# Patient Record
Sex: Female | Born: 1970 | Hispanic: No | Marital: Married | Smoking: Never smoker
Health system: Southern US, Community
[De-identification: ages and names within clinical notes are randomized; demographics above are authoritative.]

## PROBLEM LIST (undated history)

## (undated) DIAGNOSIS — D3000 Benign neoplasm of unspecified kidney: Secondary | ICD-10-CM

## (undated) DIAGNOSIS — G43909 Migraine, unspecified, not intractable, without status migrainosus: Secondary | ICD-10-CM

## (undated) HISTORY — DX: Migraine, unspecified, not intractable, without status migrainosus: G43.909

## (undated) HISTORY — DX: Benign neoplasm of unspecified kidney: D30.00

---

## 2000-07-05 HISTORY — PX: BREAST BIOPSY: SHX20

## 2002-08-07 ENCOUNTER — Encounter: Payer: Self-pay | Admitting: Emergency Medicine

## 2002-08-07 ENCOUNTER — Emergency Department (HOSPITAL_COMMUNITY): Admission: EM | Admit: 2002-08-07 | Discharge: 2002-08-07 | Payer: Self-pay | Admitting: Emergency Medicine

## 2003-02-15 ENCOUNTER — Other Ambulatory Visit: Admission: RE | Admit: 2003-02-15 | Discharge: 2003-02-15 | Payer: Self-pay | Admitting: Gynecology

## 2004-10-02 ENCOUNTER — Encounter: Admission: RE | Admit: 2004-10-02 | Discharge: 2004-10-02 | Payer: Self-pay | Admitting: Obstetrics and Gynecology

## 2004-11-09 ENCOUNTER — Other Ambulatory Visit: Admission: RE | Admit: 2004-11-09 | Discharge: 2004-11-09 | Payer: Self-pay | Admitting: Gynecology

## 2007-01-02 DIAGNOSIS — A048 Other specified bacterial intestinal infections: Secondary | ICD-10-CM | POA: Insufficient documentation

## 2007-01-16 ENCOUNTER — Ambulatory Visit: Payer: Self-pay | Admitting: Internal Medicine

## 2007-01-31 ENCOUNTER — Ambulatory Visit: Payer: Self-pay | Admitting: Internal Medicine

## 2007-01-31 LAB — CONVERTED CEMR LAB
Fecal Occult Blood: NEGATIVE
OCCULT 1: NEGATIVE
OCCULT 2: NEGATIVE
OCCULT 3: NEGATIVE
OCCULT 4: NEGATIVE
OCCULT 5: NEGATIVE

## 2008-05-27 ENCOUNTER — Ambulatory Visit: Payer: Self-pay | Admitting: Women's Health

## 2008-10-21 ENCOUNTER — Other Ambulatory Visit: Admission: RE | Admit: 2008-10-21 | Discharge: 2008-10-21 | Payer: Self-pay | Admitting: Gynecology

## 2008-10-21 ENCOUNTER — Encounter: Payer: Self-pay | Admitting: Women's Health

## 2008-10-21 ENCOUNTER — Ambulatory Visit: Payer: Self-pay | Admitting: Women's Health

## 2008-12-09 ENCOUNTER — Ambulatory Visit (HOSPITAL_COMMUNITY): Admission: RE | Admit: 2008-12-09 | Discharge: 2008-12-09 | Payer: Self-pay | Admitting: Gynecology

## 2010-04-27 ENCOUNTER — Ambulatory Visit: Payer: Self-pay | Admitting: Women's Health

## 2010-04-27 ENCOUNTER — Other Ambulatory Visit: Admission: RE | Admit: 2010-04-27 | Discharge: 2010-04-27 | Payer: Self-pay | Admitting: Gynecology

## 2010-11-17 NOTE — Assessment & Plan Note (Signed)
[facility]                         GASTROENTEROLOGY OFFICE NOTE   Janet Carney, Janet Carney                        MRN:          [id number]  DATE:01/16/2007                            DOB:          02/14/1971    REASON FOR CONSULTATION:  Abdominal pain.   ASSESSMENT:  A 40 year old [place] woman who has had problems since the  age of 12 with gas, bloating, abdominal discomfort off and on.  More  recently, she was treated for H. pylori and feels better, but still has  a lot of bloating and gaseousness.  She has some urgent defecation.  She  is also complaining of vaginal itching.  There is a scanty weight loss  over the past 3 months that she attributes to a job change.  CBC  unrevealing.  CMET normal.  KUB did show a lot of stool, and she was  told to try a stool softener.   RECOMMENDATIONS AND PLAN:  1. Align probiotic once a day for a month.  2. Low-residue, low-fiber diet to help with gaseousness and food diary      as well.  3. Dicyclomine 10 mg q.a.c. nightly p.r.n. number 90 with 5 refills.  4. Home Hemoccult.  If positive, consider endoscopic evaluation, but I      do not think that is necessary now, as I think she has irritable      bowel problems.  5. Further plans pending clinical course.  If she has persistent      problems, she should return to see me.   HISTORY:  As above.  See my medical history form for further details.  She feels a little better on the stool softener, and thinks the  antibiotics or the H. pylori helped, but she is not completely resolved.  She has really had these problems over the years.   PAST MEDICAL HISTORY:  Benign breast surgery 2001.   FAMILY HISTORY:  Diabetes in siblings.   SOCIAL HISTORY:  She is married.  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  She is here with a niece.  She lives with her  husband.  No alcohol, tobacco, or drugs.   REVIEW OF SYSTEMS:  Some pain with her period.   Itching in the vaginal  area.  Back pain.  Urinary incontinence at times.  She went to the  health department and had a negative Pap smear in February, but she did  not tell them she had vaginal itching.  I have told her she should  return there to see there is no vaginal discharge or problem there.   PHYSICAL EXAMINATION:  Reveals a pleasant, well-developed Asian woman in  no acute distress.  She has a weight of 134 pounds.  Height 5 feet 4 inches.  Blood pressure  98/64.  Pulse 72.  Eyes anicteric.  Mouth free of lesions.  NECK:  Supple without thyromegaly.  CHEST:  Clear.  HEART:  S1 and S2.  No murmurs or gallops.  ABDOMEN:  Soft and nontender.  No organomegaly or mass.  LYMPHATIC:  No neck or supraclavicular  nodes.  LOWER EXTREMITIES:  Free of edema.  SKIN:  Warm and dry.  No acute rash.  NEUROLOGIC/PSYCHIATRIC:  She is alert and oriented x3.   CBC was normal as noted.  Urinalysis negative, except for 1+ blood.  CMET was normal.  H. pylori serology was positive.   I appreciate the opportunity to care for this patient.     Iva Boop, MD,FACG  Electronically Signed    CEG/MedQ  DD: 01/16/2007  DT: 01/17/2007  Job #: [id number]   cc:   Louanna Raw

## 2011-02-11 ENCOUNTER — Encounter: Payer: Self-pay | Admitting: *Deleted

## 2011-02-15 ENCOUNTER — Other Ambulatory Visit (HOSPITAL_COMMUNITY)
Admission: RE | Admit: 2011-02-15 | Discharge: 2011-02-15 | Disposition: A | Discharge status: Home / Self Care | Payer: BC Managed Care – PPO | Source: Ambulatory Visit | Attending: Women's Health | Admitting: Women's Health

## 2011-02-15 ENCOUNTER — Ambulatory Visit (INDEPENDENT_AMBULATORY_CARE_PROVIDER_SITE_OTHER): Payer: BC Managed Care – PPO | Admitting: Women's Health

## 2011-02-15 ENCOUNTER — Encounter: Payer: Self-pay | Admitting: Women's Health

## 2011-02-15 VITALS — BP 110/60 | Ht 64.0 in | Wt 138.0 lb

## 2011-02-15 DIAGNOSIS — Z01419 Encounter for gynecological examination (general) (routine) without abnormal findings: Secondary | ICD-10-CM

## 2011-02-15 DIAGNOSIS — F411 Generalized anxiety disorder: Secondary | ICD-10-CM

## 2011-02-15 DIAGNOSIS — F419 Anxiety disorder, unspecified: Secondary | ICD-10-CM

## 2011-02-15 DIAGNOSIS — R823 Hemoglobinuria: Secondary | ICD-10-CM

## 2011-02-15 DIAGNOSIS — Z113 Encounter for screening for infections with a predominantly sexual mode of transmission: Secondary | ICD-10-CM

## 2011-02-15 DIAGNOSIS — Z13220 Encounter for screening for lipoid disorders: Secondary | ICD-10-CM

## 2011-02-15 MED ORDER — ALPRAZOLAM 0.25 MG PO TABS: 0.2500 mg | ORAL_TABLET | Freq: Every evening | ORAL | Status: AC | PRN
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
## 2011-02-17 ENCOUNTER — Telehealth: Payer: Self-pay | Admitting: Women's Health

## 2011-02-17 NOTE — Telephone Encounter (Signed)
Telephone call, informed labs are good. H&H is 13/37.2. Lipid profile is normal.

## 2011-08-20 ENCOUNTER — Other Ambulatory Visit: Payer: Self-pay | Admitting: Women's Health

## 2011-08-20 DIAGNOSIS — F329 Major depressive disorder, single episode, unspecified: Secondary | ICD-10-CM

## 2011-08-23 MED ORDER — ESCITALOPRAM OXALATE 10 MG PO TABS: 10.0000 mg | ORAL_TABLET | Freq: Every day | ORAL | Status: AC

## 2011-08-23 NOTE — Progress Notes (Signed)
Telephone call to review adding a medication with continuing therapy. Has counseling sessions with Berniece Andreas weekly. Raynelle Fanning called our office to discuss medication, suggested Lexapro 10 to start. Patient is in agreement and would like to start medication and will continue therapy. Prescription has been called to [facility].

## 2012-02-18 ENCOUNTER — Encounter: Payer: Self-pay | Admitting: Women's Health

## 2012-02-18 ENCOUNTER — Ambulatory Visit (INDEPENDENT_AMBULATORY_CARE_PROVIDER_SITE_OTHER): Payer: BC Managed Care – PPO | Admitting: Women's Health

## 2012-02-18 VITALS — BP 108/70 | Ht 63.5 in | Wt 137.0 lb

## 2012-02-18 DIAGNOSIS — Z833 Family history of diabetes mellitus: Secondary | ICD-10-CM

## 2012-02-18 DIAGNOSIS — Z01419 Encounter for gynecological examination (general) (routine) without abnormal findings: Secondary | ICD-10-CM

## 2012-02-18 DIAGNOSIS — Z13220 Encounter for screening for lipoid disorders: Secondary | ICD-10-CM

## 2012-02-18 NOTE — Patient Instructions (Signed)
Mammogram  Breast center  [phone] Maintenance, Females A healthy lifestyle and preventative care can promote health and wellness.  Maintain regular health, dental, and eye exams.   Eat a healthy diet. Foods like vegetables, fruits, whole grains, low-fat dairy products, and lean protein foods contain the nutrients you need without too many calories. Decrease your intake of foods high in solid fats, added sugars, and salt. Get information about a proper diet from your caregiver, if necessary.   Regular physical exercise is one of the most important things you can do for your health. Most adults should get at least 150 minutes of moderate-intensity exercise (any activity that increases your heart rate and causes you to sweat) each week. In addition, most adults need muscle-strengthening exercises on 2 or more days a week.    Maintain a healthy weight. The body mass index (BMI) is a screening tool to identify possible weight problems. It provides an estimate of body fat based on height and weight. Your caregiver can help determine your BMI, and can help you achieve or maintain a healthy weight. For adults 20 years and older:   A BMI below 18.5 is considered underweight.   A BMI of 18.5 to 24.9 is normal.   A BMI of 25 to 29.9 is considered overweight.   A BMI of 30 and above is considered obese.   Maintain normal blood lipids and cholesterol by exercising and minimizing your intake of saturated fat. Eat a balanced diet with plenty of fruits and vegetables. Blood tests for lipids and cholesterol should begin at age 32 and be repeated every 5 years. If your lipid or cholesterol levels are high, you are over 50, or you are a high risk for heart disease, you may need your cholesterol levels checked more frequently.Ongoing high lipid and cholesterol levels should be treated with medicines if diet and exercise are not effective.   If you smoke, find out from your caregiver how to quit. If you do  not use tobacco, do not start.   If you are pregnant, do not drink alcohol. If you are breastfeeding, be very cautious about drinking alcohol. If you are not pregnant and choose to drink alcohol, do not exceed 1 drink per day. One drink is considered to be 12 ounces (355 mL) of beer, 5 ounces (148 mL) of wine, or 1.5 ounces (44 mL) of liquor.   Avoid use of street drugs. Do not share needles with anyone. Ask for help if you need support or instructions about stopping the use of drugs.   High blood pressure causes heart disease and increases the risk of stroke. Blood pressure should be checked at least every 1 to 2 years. Ongoing high blood pressure should be treated with medicines, if weight loss and exercise are not effective.   If you are 39 to 43 years old, ask your caregiver if you should take aspirin to prevent strokes.   Diabetes screening involves taking a blood sample to check your fasting blood sugar level. This should be done once every 3 years, after age 12, if you are within normal weight and without risk factors for diabetes. Testing should be considered at a younger age or be carried out more frequently if you are overweight and have at least 1 risk factor for diabetes.   Breast cancer screening is essential preventative care for women. You should practice "breast self-awareness." This means understanding the normal appearance and feel of your breasts and may include breast self-examination. Any  changes detected, no matter how small, should be reported to a caregiver. Women in their 60s and 30s should have a clinical breast exam (CBE) by a caregiver as part of a regular health exam every 1 to 3 years. After age 20, women should have a CBE every year. Starting at age 31, women should consider having a mammogram (breast X-ray) every year. Women who have a family history of breast cancer should talk to their caregiver about genetic screening. Women at a high risk of breast cancer should talk  to their caregiver about having an MRI and a mammogram every year.   The Pap test is a screening test for cervical cancer. Women should have a Pap test starting at age 52. Between ages 45 and 39, Pap tests should be repeated every 2 years. Beginning at age 4, you should have a Pap test every 3 years as long as the past 3 Pap tests have been normal. If you had a hysterectomy for a problem that was not cancer or a condition that could lead to cancer, then you no longer need Pap tests. If you are between ages 35 and 45, and you have had normal Pap tests going back 10 years, you no longer need Pap tests. If you have had past treatment for cervical cancer or a condition that could lead to cancer, you need Pap tests and screening for cancer for at least 20 years after your treatment. If Pap tests have been discontinued, risk factors (such as a new sexual partner) need to be reassessed to determine if screening should be resumed. Some women have medical problems that increase the chance of getting cervical cancer. In these cases, your caregiver may recommend more frequent screening and Pap tests.   The human papillomavirus (HPV) test is an additional test that may be used for cervical cancer screening. The HPV test looks for the virus that can cause the cell changes on the cervix. The cells collected during the Pap test can be tested for HPV. The HPV test could be used to screen women aged 7 years and older, and should be used in women of any age who have unclear Pap test results. After the age of 23, women should have HPV testing at the same frequency as a Pap test.   Colorectal cancer can be detected and often prevented. Most routine colorectal cancer screening begins at the age of 60 and continues through age 32. However, your caregiver may recommend screening at an earlier age if you have risk factors for colon cancer. On a yearly basis, your caregiver may provide home test kits to check for hidden blood in the  stool. Use of a small camera at the end of a tube, to directly examine the colon (sigmoidoscopy or colonoscopy), can detect the earliest forms of colorectal cancer. Talk to your caregiver about this at age 79, when routine screening begins. Direct examination of the colon should be repeated every 5 to 10 years through age 40, unless early forms of pre-cancerous polyps or small growths are found.   Hepatitis C blood testing is recommended for all people born from [birth year] through 1965 and any individual with known risks for hepatitis C.   Practice safe sex. Use condoms and avoid high-risk sexual practices to reduce the spread of sexually transmitted infections (STIs). Sexually active women aged 34 and younger should be checked for Chlamydia, which is a common sexually transmitted infection. Older women with new or multiple partners should also be tested  for Chlamydia. Testing for other STIs is recommended if you are sexually active and at increased risk.   Osteoporosis is a disease in which the bones lose minerals and strength with aging. This can result in serious bone fractures. The risk of osteoporosis can be identified using a bone density scan. Women ages 73 and over and women at risk for fractures or osteoporosis should discuss screening with their caregivers. Ask your caregiver whether you should be taking a calcium supplement or vitamin D to reduce the rate of osteoporosis.   Menopause can be associated with physical symptoms and risks. Hormone replacement therapy is available to decrease symptoms and risks. You should talk to your caregiver about whether hormone replacement therapy is right for you.   Use sunscreen with a sun protection factor (SPF) of 30 or greater. Apply sunscreen liberally and repeatedly throughout the day. You should seek shade when your shadow is shorter than you. Protect yourself by wearing long sleeves, pants, a wide-brimmed hat, and sunglasses year round, whenever you are  outdoors.   Notify your caregiver of new moles or changes in moles, especially if there is a change in shape or color. Also notify your caregiver if a mole is larger than the size of a pencil eraser.   Stay current with your immunizations.  Document Released: 01/04/2011 Document Revised: 06/10/2011 Document Reviewed: 01/04/2011 [facility] Patient Information 2012 [place], [place].

## 2012-02-18 NOTE — Progress Notes (Signed)
Janet Carney 1970/07/15 [id number]    History:    The patient presents for annual exam.  Monthly 4-5 day cycle until June. July had light spotting for several days, today started cycle. Has rare intercourse uses condoms. Has had slight vaginal burning and irritation. Denies discharge with itching or odor. Denies pain or burning with urination but occasional frequency. History of normal Paps, normal mammogram at age 41 overdue. History of a breast biopsy in 2002 benign.   Past medical history, past surgical history, family history and social history were all reviewed and documented in the EPIC chart. Works at [employer]. Son 21 doing well in [place], daughter 60 doing well. From [place], has had some financial and cultural marital issues, denies physical abuse or infidelity. Father and 2 siblings type II diabetic, meds or diet, no insulin.   ROS:  A  ROS was performed and pertinent positives and negatives are included in the history.  Exam:  Filed Vitals:   02/18/12 1558  BP: 108/70    General appearance:  Normal Head/Neck:  Normal, without cervical or supraclavicular adenopathy. Thyroid:  Symmetrical, normal in size, without palpable masses or nodularity. Respiratory  Effort:  Normal  Auscultation:  Clear without wheezing or rhonchi Cardiovascular  Auscultation:  Regular rate, without rubs, murmurs or gallops  Edema/varicosities:  Not grossly evident Abdominal  Soft,nontender, without masses, guarding or rebound.  Liver/spleen:  No organomegaly noted  Hernia:  None appreciated  Skin  Inspection:  Grossly normal  Palpation:  Grossly normal Neurologic/psychiatric  Orientation:  Normal with appropriate conversation.  Mood/affect:  Normal  Genitourinary    Breasts: Examined lying and sitting.     Right: Without masses, retractions, discharge or axillary adenopathy.     Left: Without masses, retractions, discharge or axillary adenopathy.   Inguinal/mons:  Normal without inguinal  adenopathy  External genitalia:  Normal, no erythema noted  BUS/Urethra/Skene's glands:  Normal  Bladder:  Normal  Vagina:  Normal menstrual blood present no erythema or odor noted  Cervix:  Normal  Uterus:   normal in size, shape and contour.  Midline and mobile  Adnexa/parametria:     Rt: Without masses or tenderness.   Lt: Without masses or tenderness.  Anus and perineum: Normal  Digital rectal exam: Normal sphincter tone without palpated masses or tenderness  Assessment/Plan:  41 y.o. MAF G2 P2  for annual exam.   Normal GYN exam  Plan: Reviewed cycle appears normal today, instructed to keep menstrual record call if cycles less than 21 days her greater than 60. Contraception options reviewed and declined will continue with condoms. SBE's, schedule a mammogram, reviewed importance of annual screen. Encouraged regular exercise, calcium rich diet, vitamin D 1000 daily. Encouraged counseling for marital issues, no feelings of harming self. CBC, glucose, lipid panel and UA. No Pap history of normal Paps reviewed new screening guidelines.    Harrington Challenger [facility], 4:31 PM 02/18/2012

## 2012-02-19 LAB — URINALYSIS W MICROSCOPIC + REFLEX CULTURE
Bacteria, UA: NONE SEEN
Bilirubin Urine: NEGATIVE
Casts: NONE SEEN
Crystals: NONE SEEN
Ketones, ur: NEGATIVE mg/dL
Specific Gravity, Urine: 1.011 (ref 1.005–1.030)
Urobilinogen, UA: 0.2 mg/dL (ref 0.0–1.0)

## 2012-02-19 LAB — CBC WITH DIFFERENTIAL/PLATELET
Basophils Absolute: 0.1 10*3/uL (ref 0.0–0.1)
Eosinophils Relative: 1 % (ref 0–5)
HCT: 37 % (ref 36.0–46.0)
Hemoglobin: 12.7 g/dL (ref 12.0–15.0)
Lymphocytes Relative: 31 % (ref 12–46)
Lymphs Abs: 3.9 10*3/uL (ref 0.7–4.0)
MCV: 87.5 fL (ref 78.0–100.0)
Monocytes Absolute: 0.8 10*3/uL (ref 0.1–1.0)
Monocytes Relative: 7 % (ref 3–12)
Neutro Abs: 7.5 10*3/uL (ref 1.7–7.7)
RBC: 4.23 MIL/uL (ref 3.87–5.11)
WBC: 12.5 10*3/uL — ABNORMAL HIGH (ref 4.0–10.5)

## 2012-02-19 LAB — LIPID PANEL
Cholesterol: 196 mg/dL (ref 0–200)
HDL: 91 mg/dL (ref >39)
Triglycerides: 90 mg/dL (ref <150)
VLDL: 18 mg/dL (ref 0–40)

## 2012-02-23 ENCOUNTER — Encounter (HOSPITAL_COMMUNITY): Payer: Self-pay | Admitting: *Deleted

## 2012-02-23 ENCOUNTER — Emergency Department (HOSPITAL_COMMUNITY)
Admission: EM | Admit: 2012-02-23 | Discharge: 2012-02-24 | Disposition: A | Discharge status: Home / Self Care | Payer: BC Managed Care – PPO | Attending: Emergency Medicine | Admitting: Emergency Medicine

## 2012-02-23 DIAGNOSIS — Z833 Family history of diabetes mellitus: Secondary | ICD-10-CM | POA: Insufficient documentation

## 2012-02-23 DIAGNOSIS — R197 Diarrhea, unspecified: Secondary | ICD-10-CM | POA: Insufficient documentation

## 2012-02-23 DIAGNOSIS — R1110 Vomiting, unspecified: Secondary | ICD-10-CM | POA: Insufficient documentation

## 2012-02-23 DIAGNOSIS — Z801 Family history of malignant neoplasm of trachea, bronchus and lung: Secondary | ICD-10-CM | POA: Insufficient documentation

## 2012-02-23 LAB — URINALYSIS, ROUTINE W REFLEX MICROSCOPIC
Glucose, UA: NEGATIVE mg/dL
Hgb urine dipstick: NEGATIVE
Leukocytes, UA: NEGATIVE
Specific Gravity, Urine: 1.013 (ref 1.005–1.030)
Urobilinogen, UA: 0.2 mg/dL (ref 0.0–1.0)

## 2012-02-23 LAB — BASIC METABOLIC PANEL
CO2: 26 mEq/L (ref 19–32)
Chloride: 100 mEq/L (ref 96–112)
Creatinine, Ser: 0.64 mg/dL (ref 0.50–1.10)
GFR calc Af Amer: >90 mL/min (ref >90)
Potassium: 3.8 mEq/L (ref 3.5–5.1)

## 2012-02-23 LAB — CBC
MCV: 86.5 fL (ref 78.0–100.0)
Platelets: 255 10*3/uL (ref 150–400)
RBC: 4.15 MIL/uL (ref 3.87–5.11)
WBC: 12.1 10*3/uL — ABNORMAL HIGH (ref 4.0–10.5)

## 2012-02-23 LAB — POCT PREGNANCY, URINE: Preg Test, Ur: NEGATIVE

## 2012-02-23 MED ORDER — ONDANSETRON 4 MG PO TBDP
8.0000 mg | ORAL_TABLET | Freq: Once | ORAL | Status: AC
  Administered 2012-02-23: 8 mg via ORAL

## 2012-02-23 MED ORDER — ONDANSETRON HCL 4 MG PO TABS: 4.0000 mg | ORAL_TABLET | Freq: Four times a day (QID) | ORAL | Status: AC

## 2012-02-23 NOTE — ED Notes (Signed)
Prescription given with discharge instructions.  

## 2012-02-23 NOTE — ED Provider Notes (Signed)
History     CSN: [id number]  Arrival date & time 02/23/12  1931   First MD Initiated Contact with Patient 02/23/12 2115      Chief Complaint  Patient presents with  . Abdominal Pain  . Nausea  . Headache    (Consider location/radiation/quality/duration/timing/severity/associated sxs/prior treatment) Patient is a 41 y.o. female presenting with vomiting. The history is provided by the patient.  Emesis  This is a new problem. The current episode started 3 to 5 hours ago. The problem occurs 2 to 4 times per day (3). The problem has been resolved. The emesis has an appearance of stomach contents. There has been no fever. Associated symptoms include abdominal pain, diarrhea and headaches (mild frontal headache). Pertinent negatives include no chills, no cough, no fever and no sweats.  Pt at a larger than normal amount of fruit this afternoon.  Shortly after she had onset of vomiting.  Pt also had epigastric pain.  Sx resolved including abdominal pain after Zofran given in triage.  History reviewed. No pertinent past medical history.  Past Surgical History  Procedure Date  . Breast biopsy 2002    Family History  Problem Relation Age of Onset  . Lung cancer Father   . Cancer Father     lung cancer  . Diabetes Father     History  Substance Use Topics  . Smoking status: Never Smoker   . Smokeless tobacco: Never Used  . Alcohol Use: No    OB History    Grav Para Term Preterm Abortions TAB SAB Ect Mult Living   3 2   1            Review of Systems  Constitutional: Negative for fever and chills.  Respiratory: Negative for cough.   Gastrointestinal: Positive for vomiting, abdominal pain and diarrhea. Negative for blood in stool and anal bleeding.  Genitourinary: Negative for dysuria, urgency, frequency, vaginal bleeding, vaginal discharge and pelvic pain.  Neurological: Positive for headaches (mild frontal headache).  All other systems reviewed and are  negative.    Allergies  Review of patient's allergies indicates no known allergies.  Home Medications   Current Outpatient Rx  Name Route Sig Dispense Refill  . CETIRIZINE HCL 10 MG PO TABS Oral Take 10 mg by mouth daily as needed. For cold/allergies    . ONDANSETRON HCL 4 MG PO TABS Oral Take 1 tablet (4 mg total) by mouth every 6 (six) hours. 12 tablet 0    BP 113/73  Pulse 70  Temp 97.6 F (36.4 C) (Oral)  Resp 18  SpO2 100%  LMP 02/16/2012  Physical Exam  Nursing note and vitals reviewed. Constitutional: She is oriented to person, place, and time. She appears well-developed and well-nourished. No distress.  HENT:  Head: Normocephalic and atraumatic.  Eyes: Conjunctivae are normal.  Neck: Neck supple.  Cardiovascular: Normal rate, regular rhythm, normal heart sounds and intact distal pulses.   Pulmonary/Chest: Effort normal and breath sounds normal. She has no wheezes. She has no rales.  Abdominal: Soft. She exhibits no distension. There is no tenderness.  Musculoskeletal: Normal range of motion.  Neurological: She is alert and oriented to person, place, and time.  Skin: Skin is warm and dry.    ED Course  Procedures (including critical care time)  Labs Reviewed  URINALYSIS, ROUTINE W REFLEX MICROSCOPIC - Abnormal; Notable for the following:    pH 8.5 (*)     Ketones, ur 15 (*)     All other  components within normal limits  CBC - Abnormal; Notable for the following:    WBC 12.1 (*)     HCT 35.9 (*)     All other components within normal limits  BASIC METABOLIC PANEL - Abnormal; Notable for the following:    Glucose, Bld 136 (*)     All other components within normal limits  POCT PREGNANCY, URINE   No results found.   1. Vomiting   2. Diarrhea       MDM  41 yo female with no prior PMHx presents for 3 episodes of vomiting and associated epigastric abdominal pain.  Pt reports she has had problems with her stomach in the past and is only able to eat a  small amount of food at a time.  This afternoon she had a larger than normal amount of fruit.  Shortly after she had onset of non-bloody, non-bilious emesis.  She also reports 2 episodes of diarrhea this afternoon.  Onset of epigastric pain was after vomiting.  No lower abdominal pain or tenderness.  No signs of cholecystitis, appendicitis, torsion, or SBO.  No vaginal discharge or bleeding.  No dysuria.  Pt reports period had been regular until June.  She had a lighter than normal period in June, no period in July, and only a small amount of spotting this month.  Sx including abdominal pain resolved after Zofran given in triage.  AF, VSS, NAD.  CBC with slight leukocytosis.  BMP wnl.  UA negative for UTI and UPT negative.  Pt without further episodes of vomiting in the ED and appropriate for DC home.  Will DC with Rx for Zofran and PCP follow-up.  Return precautions provided.        Cherre Robins, MD 02/24/12 [phone]

## 2012-02-23 NOTE — ED Notes (Signed)
Warm blankets provided to pt. while waiting at triage.

## 2012-02-23 NOTE — ED Notes (Signed)
Patient with abdominal cramping with n/v/d since 1100 this morning, patient also with c/o headache, patient doubled over clutching abdomen stating intense pain

## 2012-02-23 NOTE — ED Notes (Signed)
Pt. Reports N/V/D starting at 1100 this AM. States hx of same. States "if I don't eat the right foods this happens to me". Pt. Currently pain free and denies nausea.

## 2012-02-24 MED ORDER — PROMETHAZINE HCL 25 MG/ML IJ SOLN
12.5000 mg | INTRAMUSCULAR | Status: DC | PRN
  Administered 2012-02-24: 12.5 mg via INTRAVENOUS
  Filled 2012-02-24: qty 1

## 2012-02-24 NOTE — ED Provider Notes (Signed)
I  reviewed the resident's note and I agree with the findings and plan.  Cheri Guppy, MD 02/24/12 [phone]

## 2012-04-14 ENCOUNTER — Emergency Department (HOSPITAL_COMMUNITY)
Admission: EM | Admit: 2012-04-14 | Discharge: 2012-04-14 | Disposition: A | Discharge status: Home / Self Care | Payer: BC Managed Care – PPO | Attending: Emergency Medicine | Admitting: Emergency Medicine

## 2012-04-14 ENCOUNTER — Encounter (HOSPITAL_COMMUNITY): Payer: Self-pay | Admitting: *Deleted

## 2012-04-14 ENCOUNTER — Emergency Department (HOSPITAL_COMMUNITY): Payer: BC Managed Care – PPO

## 2012-04-14 DIAGNOSIS — R070 Pain in throat: Secondary | ICD-10-CM | POA: Insufficient documentation

## 2012-04-14 MED ORDER — BENZOCAINE-MENTHOL 15-3.6 MG MT LOZG: 1.0000 | LOZENGE | Freq: Three times a day (TID) | OROMUCOSAL | Status: DC

## 2012-04-14 NOTE — ED Notes (Signed)
Pt alert, NAD, calm, interactive, resps e/u, speaking in clear complete sentences, no drooling or spitting. Reports "wants to leave". Wait, process & plan explained with rationale. States, "will wait 30 more minutes".

## 2012-04-14 NOTE — ED Provider Notes (Signed)
History     CSN: [id number]  Arrival date & time 04/14/12  4098   First MD Initiated Contact with Patient 04/14/12 2113      Chief Complaint  Patient presents with  . Choking   HPI  History provided by the patient and daughter. Patient is a 41 year old female with no significant PMH who presents with complaints of pain in the throat and neck area. Patient states that she feels that she may have a piece of salad stuck in her throat. She was eating salad earlier today around noon when she suddenly had a sensation of choking and coughing and since that time is felt discomfort in her throat and neck area. Symptoms are worse with swallowing. Patient has been able to swallow any since that time. Patient denies any breathing problems to me. Denies any shortness of breath or stridor. She denies having similar symptoms previously. Her salad was made of lettuce and salad dressing. Denies any meats or croutons.    History reviewed. No pertinent past medical history.  Past Surgical History  Procedure Date  . Breast biopsy 2002    Family History  Problem Relation Age of Onset  . Lung cancer Father   . Cancer Father     lung cancer  . Diabetes Father     History  Substance Use Topics  . Smoking status: Never Smoker   . Smokeless tobacco: Never Used  . Alcohol Use: No    OB History    Grav Para Term Preterm Abortions TAB SAB Ect Mult Living   3 2   1            Review of Systems  Constitutional: Negative for fever.  HENT: Positive for sore throat, trouble swallowing and neck pain.   Respiratory: Negative for shortness of breath and stridor.   Gastrointestinal: Negative for nausea and vomiting.  Skin: Negative for rash.    Allergies  Review of patient's allergies indicates no known allergies.  Home Medications   Current Outpatient Rx  Name Route Sig Dispense Refill  . CETIRIZINE HCL 10 MG PO TABS Oral Take 10 mg by mouth daily as needed. For cold/allergies      BP  125/84  Pulse 80  Temp 98.2 F (36.8 C) (Oral)  Resp 22  SpO2 100%  Physical Exam  Nursing note and vitals reviewed. Constitutional: She is oriented to person, place, and time. She appears well-developed and well-nourished. No distress.  HENT:  Head: Normocephalic.  Mouth/Throat: Oropharynx is clear and moist.  Neck: Normal range of motion. Neck supple.       Mild to moderate tenderness over right hyoid area.  No deformity.  Normal movement with swallow. No movement.   Cardiovascular: Normal rate and regular rhythm.   No murmur heard. Pulmonary/Chest: Effort normal and breath sounds normal. No stridor. No respiratory distress. She has no wheezes. She has no rales.  Neurological: She is alert and oriented to person, place, and time.  Skin: Skin is warm and dry. No rash noted.  Psychiatric: She has a normal mood and affect. Her behavior is normal.    ED Course  Procedures   Dg Neck Soft Tissue  04/14/2012  *RADIOLOGY REPORT*  Clinical Data: Choking on food, anterior neck pain.  NECK SOFT TISSUES - 1+ VIEW  Comparison: None.  Findings: Irregular calcification immediately inferior to the vocal cords may reflect an unusual appearance of the thyroid cartilage.  However, there is associated smooth prevertebral soft tissue swelling, likely reflecting  laryngeal edema.  IMPRESSION: Smooth prevertebral soft tissue swelling, likely reflecting laryngeal edema.  Consider CT neck for further evaluation as clinically warranted.   Original Report Authenticated By: Charline Bills, M.D.      1. Throat pain       MDM  9:45PM patient seen and evaluated. Patient denies any respiratory difficulty to me. Pain with swallowing. Patient is able to swallow fluids normally. Patient is maintaining secretions.   X-rays with possible signs of slight swelling. This may be local reaction from irritation or injury. No hemoptysis. No crepitus and neck. Patient does not appear to have any emergent symptoms at  this time. Will give symptomatic treatment and have patient followup with ENT or return for any worsening symptoms. Patient was discussed with attending physician who agrees with treatment plan        Angus Seller, PA 04/15/12 [phone]

## 2012-04-14 NOTE — ED Notes (Signed)
The pt choked on a salad at 1200n today.  She feel ike she cannot breathe.  She can drink water and it stays down.

## 2012-04-14 NOTE — ED Notes (Signed)
Pt reports foreign body sensation in her throat that began a few hours ago after eating a salad - pt speaking complete sentences, handling secretions - in no acute distress A&Ox4. Pt also reports sore throat as well.

## 2012-04-14 NOTE — ED Notes (Signed)
Rx given x1 Pt ambulating independently w/ steady gait on d/c in no acute distress, A&Ox4. D/c instructions reviewed w/ pt and family - pt and family deny any further questions or concerns at present.  

## 2012-04-14 NOTE — ED Notes (Signed)
She had ahmburger in her salad and she feels like something is caught inher throat.

## 2012-04-15 NOTE — ED Provider Notes (Signed)
Medical screening examination/treatment/procedure(s) were performed by non-physician practitioner and as supervising physician I was immediately available for consultation/collaboration.   Gwyneth Sprout, MD 04/15/12 [phone]

## 2012-08-09 ENCOUNTER — Other Ambulatory Visit: Payer: Self-pay | Admitting: Women's Health

## 2012-08-09 DIAGNOSIS — Z1231 Encounter for screening mammogram for malignant neoplasm of breast: Secondary | ICD-10-CM

## 2012-08-17 ENCOUNTER — Ambulatory Visit (HOSPITAL_COMMUNITY): Payer: BC Managed Care – PPO

## 2012-08-24 ENCOUNTER — Ambulatory Visit (HOSPITAL_COMMUNITY)
Admission: RE | Admit: 2012-08-24 | Discharge: 2012-08-24 | Disposition: A | Discharge status: Home / Self Care | Payer: BC Managed Care – PPO | Source: Ambulatory Visit | Attending: Women's Health | Admitting: Women's Health

## 2012-08-24 DIAGNOSIS — Z1231 Encounter for screening mammogram for malignant neoplasm of breast: Secondary | ICD-10-CM

## 2012-11-03 ENCOUNTER — Ambulatory Visit (INDEPENDENT_AMBULATORY_CARE_PROVIDER_SITE_OTHER): Payer: BC Managed Care – PPO | Admitting: Gynecology

## 2012-11-03 ENCOUNTER — Encounter: Payer: Self-pay | Admitting: Gynecology

## 2012-11-03 DIAGNOSIS — R300 Dysuria: Secondary | ICD-10-CM

## 2012-11-03 DIAGNOSIS — N390 Urinary tract infection, site not specified: Secondary | ICD-10-CM

## 2012-11-03 LAB — URINALYSIS W MICROSCOPIC + REFLEX CULTURE
Casts: NONE SEEN
Crystals: NONE SEEN
Ketones, ur: NEGATIVE mg/dL
Nitrite: NEGATIVE
Specific Gravity, Urine: 1.02 (ref 1.005–1.030)
Urobilinogen, UA: 0.2 mg/dL (ref 0.0–1.0)
pH: 7 (ref 5.0–8.0)

## 2012-11-03 MED ORDER — SULFAMETHOXAZOLE-TMP DS 800-160 MG PO TABS: 1.0000 | ORAL_TABLET | Freq: Two times a day (BID) | ORAL | Status: DC

## 2012-11-03 NOTE — Progress Notes (Signed)
Patient presents with three-day history of dysuria. Does have a history of occasional UTIs in the past. Some frequency. No blood in her urine, back pain, fever chills nausea vomiting.  Exam Spine straight without CVA tenderness. Abdomen soft nontender without masses guarding rebound organomegaly.  Assessment and plan: History and urinalysis consistent with UTI. Treat with Septra DS 1 by mouth twice a day x3 days. Followup if symptoms persist worsen or recur.

## 2012-11-03 NOTE — Patient Instructions (Signed)
Take antibiotic twice daily for 3 days. Follow up if your symptoms persist, worsen or recur. 

## 2013-01-08 ENCOUNTER — Emergency Department (HOSPITAL_COMMUNITY): Payer: BC Managed Care – PPO

## 2013-01-08 ENCOUNTER — Encounter (HOSPITAL_COMMUNITY): Payer: Self-pay | Admitting: Emergency Medicine

## 2013-01-08 ENCOUNTER — Observation Stay (HOSPITAL_COMMUNITY)
Admission: EM | Admit: 2013-01-08 | Discharge: 2013-01-09 | Disposition: A | Discharge status: Home / Self Care | Payer: BC Managed Care – PPO | Attending: Internal Medicine | Admitting: Internal Medicine

## 2013-01-08 DIAGNOSIS — G43109 Migraine with aura, not intractable, without status migrainosus: Principal | ICD-10-CM | POA: Insufficient documentation

## 2013-01-08 DIAGNOSIS — R4789 Other speech disturbances: Secondary | ICD-10-CM

## 2013-01-08 DIAGNOSIS — G43909 Migraine, unspecified, not intractable, without status migrainosus: Secondary | ICD-10-CM

## 2013-01-08 DIAGNOSIS — R4701 Aphasia: Secondary | ICD-10-CM | POA: Insufficient documentation

## 2013-01-08 DIAGNOSIS — R209 Unspecified disturbances of skin sensation: Secondary | ICD-10-CM | POA: Insufficient documentation

## 2013-01-08 DIAGNOSIS — R51 Headache: Secondary | ICD-10-CM

## 2013-01-08 DIAGNOSIS — G43809 Other migraine, not intractable, without status migrainosus: Secondary | ICD-10-CM

## 2013-01-08 DIAGNOSIS — G459 Transient cerebral ischemic attack, unspecified: Secondary | ICD-10-CM

## 2013-01-08 LAB — COMPREHENSIVE METABOLIC PANEL
ALT: 11 U/L (ref 0–35)
Alkaline Phosphatase: 53 U/L (ref 39–117)
BUN: 9 mg/dL (ref 6–23)
CO2: 28 mEq/L (ref 19–32)
Calcium: 9.3 mg/dL (ref 8.4–10.5)
GFR calc Af Amer: >90 mL/min (ref >90)
GFR calc non Af Amer: >90 mL/min (ref >90)
Glucose, Bld: 96 mg/dL (ref 70–99)
Potassium: 4.1 mEq/L (ref 3.5–5.1)
Sodium: 137 mEq/L (ref 135–145)
Total Protein: 6.7 g/dL (ref 6.0–8.3)

## 2013-01-08 LAB — CBC WITH DIFFERENTIAL/PLATELET
Eosinophils Absolute: 0.1 10*3/uL (ref 0.0–0.7)
Eosinophils Relative: 2 % (ref 0–5)
Hemoglobin: 12.6 g/dL (ref 12.0–15.0)
Lymphocytes Relative: 32 % (ref 12–46)
Lymphs Abs: 2.9 10*3/uL (ref 0.7–4.0)
MCH: 29.9 pg (ref 26.0–34.0)
MCV: 85.3 fL (ref 78.0–100.0)
Monocytes Relative: 9 % (ref 3–12)
Platelets: 255 10*3/uL (ref 150–400)
RBC: 4.21 MIL/uL (ref 3.87–5.11)
WBC: 8.9 10*3/uL (ref 4.0–10.5)

## 2013-01-08 LAB — URINALYSIS, ROUTINE W REFLEX MICROSCOPIC
Bilirubin Urine: NEGATIVE
Ketones, ur: NEGATIVE mg/dL
Leukocytes, UA: NEGATIVE
Nitrite: NEGATIVE
Urobilinogen, UA: 0.2 mg/dL (ref 0.0–1.0)

## 2013-01-08 MED ORDER — ASPIRIN EC 81 MG PO TBEC
81.0000 mg | DELAYED_RELEASE_TABLET | Freq: Every day | ORAL | Status: DC
  Administered 2013-01-09: 81 mg via ORAL
  Filled 2013-01-08: qty 1

## 2013-01-08 MED ORDER — ASPIRIN 81 MG PO CHEW
324.0000 mg | CHEWABLE_TABLET | Freq: Once | ORAL | Status: AC
  Administered 2013-01-08: 324 mg via ORAL
  Filled 2013-01-08: qty 4

## 2013-01-08 NOTE — ED Notes (Signed)
PT. REPORTS BRIEF EPISODE OF GENERALIZED WEAKNESS WITH PALPITATIONS , RIGHT SIDE BODY NUMBNESS /HEADACHE AT 2 PM THIS AFTERNOON , ALERT AND ORIENTED , DENIES CHEST PAIN OR SOB , SPEECH CLEAR , NO FACIAL ASYMMETRY , EQUAL GRIPS / AMBULATORY.

## 2013-01-08 NOTE — ED Provider Notes (Signed)
History    CSN: [id number] Arrival date & time 01/08/13  1959  First MD Initiated Contact with Patient 01/08/13 2147     Chief Complaint  Patient presents with  . Weakness  . Palpitations  . Numbness   (Consider location/radiation/quality/duration/timing/severity/associated sxs/prior Treatment) HPI complains of difficulty throbbing headache gradual onset approximately 5 days ago  today at 12 noon he developed  difficulty speaking i.e. she is saying words that she did not mean to say. She also reports difficulty using her right hand for approximately 2 hours and numbness in her right hand. She is presently feels much improved, without treatment. Only complains of mild headache presently. She also had palpitations i.e. rapid heartbeat for approximately 2 hours. No other associated symptoms. Nothing makes symptoms better or worse however symptoms improved spontaneously without treatment. History reviewed. No pertinent past medical history. Past Surgical History  Procedure Laterality Date  . Breast biopsy  2002   Family History  Problem Relation Age of Onset  . Lung cancer Father   . Cancer Father     lung cancer  . Diabetes Father    History  Substance Use Topics  . Smoking status: Never Smoker   . Smokeless tobacco: Never Used  . Alcohol Use: No   OB History   Grav Para Term Preterm Abortions TAB SAB Ect Mult Living   3 2   1           Review of Systems  Cardiovascular: Positive for palpitations.  Neurological: Positive for speech difficulty, weakness and numbness.       Weakness and right hand  All other systems reviewed and are negative.    Allergies  Review of patient's allergies indicates no known allergies.  Home Medications   Current Outpatient Rx  Name  Route  Sig  Dispense  Refill  . ibuprofen (ADVIL,MOTRIN) 200 MG tablet   Oral   Take 200 mg by mouth every 6 (six) hours as needed for pain or headache.          BP 128/72  Pulse 69  Temp(Src) 98.3 F  (36.8 C) (Oral)  Resp 14  SpO2 98%  LMP 12/18/2012 Physical Exam  Nursing note and vitals reviewed. Constitutional: She is oriented to person, place, and time. She appears well-developed and well-nourished.  HENT:  Head: Normocephalic and atraumatic.  Eyes: Conjunctivae are normal. Pupils are equal, round, and reactive to light.  Neck: Neck supple. No tracheal deviation present. No thyromegaly present.  Cardiovascular: Normal rate and regular rhythm.   No murmur heard. Pulmonary/Chest: Effort normal and breath sounds normal.  Abdominal: Soft. Bowel sounds are normal. She exhibits no distension. There is no tenderness.  Musculoskeletal: Normal range of motion. She exhibits no edema and no tenderness.  Neurological: She is alert and oriented to person, place, and time. She has normal reflexes. Coordination normal.  Gait normal Romberg normal prior drift normal  Skin: Skin is warm and dry. No rash noted.  Psychiatric: She has a normal mood and affect.    ED Course  Procedures (including critical care time) Labs Reviewed  CBC WITH DIFFERENTIAL - Abnormal; Notable for the following:    HCT 35.9 (*)    All other components within normal limits  COMPREHENSIVE METABOLIC PANEL - Abnormal; Notable for the following:    Total Bilirubin 0.2 (*)    All other components within normal limits  URINALYSIS, ROUTINE W REFLEX MICROSCOPIC - Abnormal; Notable for the following:    Hgb urine dipstick  MODERATE (*)    All other components within normal limits  URINE MICROSCOPIC-ADD ON  POCT PREGNANCY, URINE   No results found. No diagnosis found.  Date: 01/08/2013  Rate:   Rhythm: normal sinus rhythm  QRS Axis: normal  Intervals: normal  ST/T Wave abnormalities: normal  Conduction Disutrbances: none  Narrative Interpretation: unremarkable  Results for orders placed during the hospital encounter of 01/08/13  CBC WITH DIFFERENTIAL      Result Value Range   WBC 8.9  4.0 - 10.5 K/uL   RBC  4.21  3.87 - 5.11 MIL/uL   Hemoglobin 12.6  12.0 - 15.0 g/dL   HCT 16.1 (*) 09.6 - 04.5 %   MCV 85.3  78.0 - 100.0 fL   MCH 29.9  26.0 - 34.0 pg   MCHC 35.1  30.0 - 36.0 g/dL   RDW 40.9  81.1 - 91.4 %   Platelets 255  150 - 400 K/uL   Neutrophils Relative % 57  43 - 77 %   Neutro Abs 5.1  1.7 - 7.7 K/uL   Lymphocytes Relative 32  12 - 46 %   Lymphs Abs 2.9  0.7 - 4.0 K/uL   Monocytes Relative 9  3 - 12 %   Monocytes Absolute 0.8  0.1 - 1.0 K/uL   Eosinophils Relative 2  0 - 5 %   Eosinophils Absolute 0.1  0.0 - 0.7 K/uL   Basophils Relative 0  0 - 1 %   Basophils Absolute 0.0  0.0 - 0.1 K/uL  COMPREHENSIVE METABOLIC PANEL      Result Value Range   Sodium 137  135 - 145 mEq/L   Potassium 4.1  3.5 - 5.1 mEq/L   Chloride 101  96 - 112 mEq/L   CO2 28  19 - 32 mEq/L   Glucose, Bld 96  70 - 99 mg/dL   BUN 9  6 - 23 mg/dL   Creatinine, Ser 7.82  0.50 - 1.10 mg/dL   Calcium 9.3  8.4 - 95.6 mg/dL   Total Protein 6.7  6.0 - 8.3 g/dL   Albumin 3.9  3.5 - 5.2 g/dL   AST 13  0 - 37 U/L   ALT 11  0 - 35 U/L   Alkaline Phosphatase 53  39 - 117 U/L   Total Bilirubin 0.2 (*) 0.3 - 1.2 mg/dL   GFR calc non Af Amer >90  >90 mL/min   GFR calc Af Amer >90  >90 mL/min  URINALYSIS, ROUTINE W REFLEX MICROSCOPIC      Result Value Range   Color, Urine YELLOW  YELLOW   APPearance CLEAR  CLEAR   Specific Gravity, Urine 1.005  1.005 - 1.030   pH 7.5  5.0 - 8.0   Glucose, UA NEGATIVE  NEGATIVE mg/dL   Hgb urine dipstick MODERATE (*) NEGATIVE   Bilirubin Urine NEGATIVE  NEGATIVE   Ketones, ur NEGATIVE  NEGATIVE mg/dL   Protein, ur NEGATIVE  NEGATIVE mg/dL   Urobilinogen, UA 0.2  0.0 - 1.0 mg/dL   Nitrite NEGATIVE  NEGATIVE   Leukocytes, UA NEGATIVE  NEGATIVE  URINE MICROSCOPIC-ADD ON      Result Value Range   Squamous Epithelial / LPF RARE  RARE   RBC / HPF 7-10  <3 RBC/hpf   Bacteria, UA RARE  RARE  POCT PREGNANCY, URINE      Result Value Range   Preg Test, Ur NEGATIVE  NEGATIVE   Ct  Head Wo Contrast  01/08/2013   *RADIOLOGY REPORT*  Clinical Data: Weakness, palpitations.  CT HEAD WITHOUT CONTRAST  Technique:  Contiguous axial images were obtained from the base of the skull through the vertex without contrast.  Comparison: None.  Findings: No acute intracranial abnormality.  Specifically, no hemorrhage, hydrocephalus, mass lesion, acute infarction, or significant intracranial injury.  No acute calvarial abnormality. Visualized paranasal sinuses and mastoids clear.  Orbital soft tissues unremarkable.  IMPRESSION: Normal study.   Original Report Authenticated By: Charlett Nose, M.D.    Dr. Cyril Mourning evaluated patient in the ED and suggests TIA evaluation. Aspirin ordered by me MDM  Differential diagnoses of symptoms include migraine headache versus TIA. Neurology consult called to evaluate the patient in ED Spoke with Dr. Conley Rolls who will arrange for inpatient stay  Doug Sou, MD 01/09/13 0005

## 2013-01-08 NOTE — ED Notes (Signed)
Pt states she "has a tremendous amount of stress at home right now".

## 2013-01-08 NOTE — Consult Note (Signed)
NEURO HOSPITALIST CONSULT NOTE    Reason for Consult: headache with transient left hand numbness and difficulty talking.  HPI:                                                                                                                                          Janet Carney is an 42 y.o. female with no significant past medical history who presents to [facility] due to acute onset left hand numbness, difficulty talking, and headache. Stated that never had similar symptoms before. She indicted that she has been experiencing HA for the last couple of days which is unusual for today. Earlier today she was sitting in a chair at work when started noticing blurred vision that impaired her vision, and subsequently noted numbness of the right hand as well as difficulty speaking. She said that " is like there was something and my throat and I couldn't breathe or speak normally". She tells me that she called her mother on the phone and noted that she couldn't get the right word out. The numbness of the right hand lasted for 10-15 minutes and the trouble speaking had even a shorter duration. Denies vertigo, double vision, focal weakness, unsteadiness, or difficulty swallowing. CT brain unremarkable.   History reviewed. No pertinent past medical history.  Past Surgical History  Procedure Laterality Date  . Breast biopsy  2002    Family History  Problem Relation Age of Onset  . Lung cancer Father   . Cancer Father     lung cancer  . Diabetes Father     Family History:   Social History:  reports that she has never smoked. She has never used smokeless tobacco. She reports that she does not drink alcohol or use illicit drugs.  No Known Allergies  MEDICATIONS:                                                                                                                     I have reviewed the patient's current medications.   ROS:  History obtained from the patient and chart review  General ROS: negative for - chills, fatigue, fever, night sweats, weight gain or weight loss Psychological ROS: negative for - behavioral disorder, hallucinations, memory difficulties, mood swings or suicidal ideation Ophthalmic ROS: negative for - double vision, eye pain ENT ROS: negative for - epistaxis, nasal discharge, oral lesions, sore throat, tinnitus or vertigo Allergy and Immunology ROS: negative for - hives or itchy/watery eyes Hematological and Lymphatic ROS: negative for - bleeding problems, bruising or swollen lymph nodes Endocrine ROS: negative for - galactorrhea, hair pattern changes, polydipsia/polyuria or temperature intolerance Respiratory ROS: negative for - cough, hemoptysis, shortness of breath or wheezing Cardiovascular ROS: negative for - chest pain, dyspnea on exertion, edema or irregular heartbeat Gastrointestinal ROS: negative for - abdominal pain, diarrhea, hematemesis, nausea/vomiting or stool incontinence Genito-Urinary ROS: negative for - dysuria, hematuria, incontinence or urinary frequency/urgency Musculoskeletal ROS: negative for - joint swelling or muscular weakness Neurological ROS: as noted in HPI Dermatological ROS: negative for rash and skin lesion changes     Physical exam: pleasant female in no apparent distress. Blood pressure 128/72, pulse 69, temperature 98.3 F (36.8 C), temperature source Oral, resp. rate 14, last menstrual period 12/18/2012, SpO2 98.00%. Head: normocephalic. Neck: supple, no bruits, no JVD. Cardiac: no murmurs. Lungs: clear. Abdomen: soft, no tender, no mass. Extremities: no edema.     Neurologic Examination:                                                                                                       Mental Status: Alert, oriented, thought content appropriate.  Speech  fluent without evidence of aphasia.  Able to follow 3 step commands without difficulty. Cranial Nerves: II: Discs flat bilaterally; Visual fields grossly normal, pupils equal, round, reactive to light and accommodation III,IV, VI: ptosis not present, extra-ocular motions intact bilaterally V,VII: smile symmetric, facial light touch sensation normal bilaterally VIII: hearing normal bilaterally IX,X: gag reflex present XI: bilateral shoulder shrug XII: midline tongue extension Motor: Right : Upper extremity   5/5    Left:     Upper extremity   5/5  Lower extremity   5/5     Lower extremity   5/5 Tone and bulk:normal tone throughout; no atrophy noted Sensory: Pinprick and light touch intact throughout, bilaterally Deep Tendon Reflexes:  2+ all over  Plantars: Right: downgoing   Left: downgoing Cerebellar: normal finger-to-nose,  normal heel-to-shin test Gait:  No ataxia CV: pulses palpable throughout   Lab Results  Component Value Date/Time   CHOL 196 02/18/2012  4:32 PM    Results for orders placed during the hospital encounter of 01/08/13 (from the past 48 hour(s))  URINALYSIS, ROUTINE W REFLEX MICROSCOPIC     Status: Abnormal   Collection Time    01/08/13  8:21 PM      Result Value Range   Color, Urine YELLOW  YELLOW   APPearance CLEAR  CLEAR   Specific Gravity, Urine 1.005  1.005 - 1.030   pH 7.5  5.0 - 8.0   Glucose, UA NEGATIVE  NEGATIVE mg/dL   Hgb urine dipstick MODERATE (*) NEGATIVE   Bilirubin Urine NEGATIVE  NEGATIVE   Ketones, ur NEGATIVE  NEGATIVE mg/dL   Protein, ur NEGATIVE  NEGATIVE mg/dL   Urobilinogen, UA 0.2  0.0 - 1.0 mg/dL   Nitrite NEGATIVE  NEGATIVE   Leukocytes, UA NEGATIVE  NEGATIVE  URINE MICROSCOPIC-ADD ON     Status: None   Collection Time    01/08/13  8:21 PM      Result Value Range   Squamous Epithelial / LPF RARE  RARE   RBC / HPF 7-10  <3 RBC/hpf   Bacteria, UA RARE  RARE  CBC WITH DIFFERENTIAL     Status: Abnormal   Collection Time     01/08/13  8:22 PM      Result Value Range   WBC 8.9  4.0 - 10.5 K/uL   RBC 4.21  3.87 - 5.11 MIL/uL   Hemoglobin 12.6  12.0 - 15.0 g/dL   HCT 16.1 (*) 09.6 - 04.5 %   MCV 85.3  78.0 - 100.0 fL   MCH 29.9  26.0 - 34.0 pg   MCHC 35.1  30.0 - 36.0 g/dL   RDW 40.9  81.1 - 91.4 %   Platelets 255  150 - 400 K/uL   Neutrophils Relative % 57  43 - 77 %   Neutro Abs 5.1  1.7 - 7.7 K/uL   Lymphocytes Relative 32  12 - 46 %   Lymphs Abs 2.9  0.7 - 4.0 K/uL   Monocytes Relative 9  3 - 12 %   Monocytes Absolute 0.8  0.1 - 1.0 K/uL   Eosinophils Relative 2  0 - 5 %   Eosinophils Absolute 0.1  0.0 - 0.7 K/uL   Basophils Relative 0  0 - 1 %   Basophils Absolute 0.0  0.0 - 0.1 K/uL  COMPREHENSIVE METABOLIC PANEL     Status: Abnormal   Collection Time    01/08/13  8:22 PM      Result Value Range   Sodium 137  135 - 145 mEq/L   Potassium 4.1  3.5 - 5.1 mEq/L   Chloride 101  96 - 112 mEq/L   CO2 28  19 - 32 mEq/L   Glucose, Bld 96  70 - 99 mg/dL   BUN 9  6 - 23 mg/dL   Creatinine, Ser 7.82  0.50 - 1.10 mg/dL   Calcium 9.3  8.4 - 95.6 mg/dL   Total Protein 6.7  6.0 - 8.3 g/dL   Albumin 3.9  3.5 - 5.2 g/dL   AST 13  0 - 37 U/L   ALT 11  0 - 35 U/L   Alkaline Phosphatase 53  39 - 117 U/L   Total Bilirubin 0.2 (*) 0.3 - 1.2 mg/dL   GFR calc non Af Amer >90  >90 mL/min   GFR calc Af Amer >90  >90 mL/min   Comment:            The eGFR has been calculated     using the CKD EPI equation.     This calculation has not been     validated in all clinical     situations.     eGFR's persistently     <90 mL/min signify     possible Chronic Kidney Disease.  POCT PREGNANCY, URINE     Status: None   Collection Time    01/08/13  8:24 PM  Result Value Range   Preg Test, Ur NEGATIVE  NEGATIVE   Comment:            THE SENSITIVITY OF THIS     METHODOLOGY IS >24 mIU/mL    Ct Head Wo Contrast  01/08/2013   *RADIOLOGY REPORT*  Clinical Data: Weakness, palpitations.  CT HEAD WITHOUT CONTRAST   Technique:  Contiguous axial images were obtained from the base of the skull through the vertex without contrast.  Comparison: None.  Findings: No acute intracranial abnormality.  Specifically, no hemorrhage, hydrocephalus, mass lesion, acute infarction, or significant intracranial injury.  No acute calvarial abnormality. Visualized paranasal sinuses and mastoids clear.  Orbital soft tissues unremarkable.  IMPRESSION: Normal study.   Original Report Authenticated By: Charlett Nose, M.D.     Assessment/Plan: 42 y/o without known risk factors for stroke, presents to the hospital with new onset transient right hand numbness, difficulty expressing herself, and headache. Neuro-exam unremarkable, CT brain showed no acute abnormality. Differential includes TIA versus new onset migraine without aura. Decided to admit for TIA work up. Start aspirin 81 mg daily. Stroke team to resume care in the morning.   Wyatt Portela, MD [facility] [phone]  01/08/2013, 11:14 PM

## 2013-01-09 ENCOUNTER — Encounter (HOSPITAL_COMMUNITY): Payer: Self-pay | Admitting: Internal Medicine

## 2013-01-09 ENCOUNTER — Observation Stay (HOSPITAL_COMMUNITY): Payer: BC Managed Care – PPO

## 2013-01-09 DIAGNOSIS — G459 Transient cerebral ischemic attack, unspecified: Secondary | ICD-10-CM

## 2013-01-09 DIAGNOSIS — G43809 Other migraine, not intractable, without status migrainosus: Secondary | ICD-10-CM

## 2013-01-09 DIAGNOSIS — G43909 Migraine, unspecified, not intractable, without status migrainosus: Secondary | ICD-10-CM | POA: Diagnosis present

## 2013-01-09 LAB — LIPID PANEL
Cholesterol: 177 mg/dL (ref 0–200)
Cholesterol: 180 mg/dL (ref 0–200)
HDL: 90 mg/dL (ref >39)
Total CHOL/HDL Ratio: 2 RATIO
Triglycerides: 61 mg/dL (ref <150)

## 2013-01-09 LAB — RAPID URINE DRUG SCREEN, HOSP PERFORMED
Amphetamines: NOT DETECTED
Barbiturates: NOT DETECTED
Opiates: NOT DETECTED
Tetrahydrocannabinol: NOT DETECTED

## 2013-01-09 LAB — HEMOGLOBIN A1C
Hgb A1c MFr Bld: 5.2 % (ref <5.7)
Mean Plasma Glucose: 103 mg/dL (ref <117)

## 2013-01-09 MED ORDER — SUMATRIPTAN SUCCINATE 50 MG PO TABS: 50.0000 mg | ORAL_TABLET | ORAL | Status: DC | PRN

## 2013-01-09 MED ORDER — IBUPROFEN 200 MG PO TABS
200.0000 mg | ORAL_TABLET | Freq: Four times a day (QID) | ORAL | Status: DC | PRN
  Filled 2013-01-09: qty 1

## 2013-01-09 NOTE — Progress Notes (Signed)
  Echocardiogram 2D Echocardiogram has been performed.  Cathie Beams 01/09/2013, 10:43 AM

## 2013-01-09 NOTE — Discharge Summary (Signed)
Physician Discharge Summary  Tamsen Reist ZOX:[id number] DOB: [birth year] DOA: 01/08/2013  PCP: No PCP Per Patient  Admit date: 01/08/2013 Discharge date: 01/09/2013  Time spent: 25 minutes  Recommendations for Outpatient Follow-up:  1. Patient is being discharged home with a new prescription for Imitrex when necessary  Discharge Diagnoses:  Principal Problem:   Migraine syndrome   Discharge Condition: Improved, being discharged home  Diet recommendation: Regular  Filed Weights   01/09/13 0145  Weight: 65.1 kg (143 lb 8.3 oz)    History of present illness:   on 7/7: Janet Carney is an 42 y.o. female with benign PMH on no chronic medication, Hx of bitemporal HA, presents to the ER with hx of bitemperal HA with blurry peripheral Vx, and about 15 minutes of expressive aphasia. She also has left hand numbness as well. No prior similar symptoms, and no "warning signs". She is not on oral contraceptives, or ERT, and denied drug, tobacco, or alcohol use. She is asymptomatic in the ER. Work up in the ER included a negative head CT, and normal serologies. [facility] was consulted and recommended full TIA work up. Hospitalist was asked to admit for complete work up.   Hospital Course:  Patient was brought in to the hospital. Her symptoms have all completely resolved. Her headache has resolved. MRI was negative. Carotid Dopplers were preliminary negative. Vital signs including blood pressure have remained stable and low. Do not think that this is a TIA. Would not recommend adding aspirin at this time. Feel that this is likely migraine with aura. Have recommended continuing when necessary Motrin plus when necessary Imitrex. Prescription given for patient.  Procedures:  Carotid Dopplers: Report no evidence of stenoses bilaterally  Echocardiogram: Pending  Consultations:  None  Discharge Exam: Filed Vitals:   01/09/13 0001 01/09/13 0100 01/09/13 0145 01/09/13 0627  BP: 122/72 107/67  125/71 109/75  Pulse: 50 56 50 78  Temp:   97.6 F (36.4 C) 98.1 F (36.7 C)  TempSrc:   Oral Oral  Resp: 16  16 16   Height:   5\' 4"  (1.626 m)   Weight:   65.1 kg (143 lb 8.3 oz)   SpO2: 99% 99% 100% 100%    General: Alert and oriented x3, no acute distress Cardiovascular: Regular rate and rhythm, S1-S2 Respiratory: Clear to auscultation bilaterally Abdomen: Soft, nontender, nondistended, positive bowel sounds Extremities: No clubbing or cyanosis or edema Neuro: No focal deficits. Flexion, extension and grip are symmetric for both upper and lower extremities  Discharge Instructions  Discharge Orders   Future Orders Complete By Expires     Diet general  As directed     Increase activity slowly  As directed         Medication List         ibuprofen 200 MG tablet  Commonly known as:  ADVIL,MOTRIN  Take 200 mg by mouth every 6 (six) hours as needed for pain or headache.     SUMAtriptan 50 MG tablet  Commonly known as:  IMITREX  Take 1 tablet (50 mg total) by mouth every 2 (two) hours as needed for migraine.       No Known Allergies    The results of significant diagnostics from this hospitalization (including imaging, microbiology, ancillary and laboratory) are listed below for reference.    Significant Diagnostic Studies: Ct Head Wo Contrast  01/08/2013   .  IMPRESSION: Normal study.   Original Report Authenticated By: Charlett Nose, M.D.   Mr Maxine Glenn  Head Wo Contrast  01/09/2013     IMPRESSION: Negative intracranial MRA.   Original Report Authenticated By: Erskine Speed, M.D.   Mr Brain Wo Contrast  01/09/2013   .  IMPRESSION: 1. Normal noncontrast MRI appearance of the brain. 2.  MRA findings are below.   Labs: Basic Metabolic Panel:  Recent Labs Lab 01/08/13 2022  NA 137  K 4.1  CL 101  CO2 28  GLUCOSE 96  BUN 9  CREATININE 0.70  CALCIUM 9.3   Liver Function Tests:  Recent Labs Lab 01/08/13 2022  AST 13  ALT 11  ALKPHOS 53  BILITOT 0.2*  PROT 6.7   ALBUMIN 3.9   CBC:  Recent Labs Lab 01/08/13 2022  WBC 8.9  NEUTROABS 5.1  HGB 12.6  HCT 35.9*  MCV 85.3  PLT 255     Signed:  Zoe Goonan K  [facility] 01/09/2013, 10:47 AM

## 2013-01-09 NOTE — Progress Notes (Signed)
Patient discharge this afternoon, assessments remained unchanged as at now. Education given on the need to follow up after discharge and to recognise the symptoms of stroke when the need arises.

## 2013-01-09 NOTE — Progress Notes (Signed)
Stroke Team Progress Note  HISTORY Janet Carney is an 42 y.o. female with no significant past medical history who presents to [facility] ED 01/08/2013 due to acute onset left hand numbness, difficulty talking, and headache. Stated that never had similar symptoms before. She indicted that she has been experiencing HA for the last couple of days which is unusual for today. Earlier today she was sitting in a chair at work when started noticing blurred vision that impaired her vision, and subsequently noted numbness of the right hand as well as difficulty speaking. She said that " is like there was something and my throat and I couldn't breathe or speak normally". She tells me that she called her mother on the phone and noted that she couldn't get the right word out. The numbness of the right hand lasted for 10-15 minutes and the trouble speaking had even a shorter duration. Denies vertigo, double vision, focal weakness, unsteadiness, or difficulty swallowing. CT brain unremarkable. Patient was not a TPA candidate secondary to quick resolution of symptoms. She was admitted for further evaluation and treatment.  SUBJECTIVE Patient is currently in vascular lab.  Overall she feels her condition is gradually improving.   OBJECTIVE Most recent Vital Signs: Filed Vitals:   01/09/13 0001 01/09/13 0100 01/09/13 0145 01/09/13 0627  BP: 122/72 107/67 125/71 109/75  Pulse: 50 56 50 78  Temp:   97.6 F (36.4 C) 98.1 F (36.7 C)  TempSrc:   Oral Oral  Resp: 16  16 16   Height:   5\' 4"  (1.626 m)   Weight:   65.1 kg (143 lb 8.3 oz)   SpO2: 99% 99% 100% 100%   CBG (last 3)  No results found for this basename: GLUCAP,  in the last 72 hours  IV Fluid Intake:     MEDICATIONS  . aspirin EC  81 mg Oral Daily   PRN:  ibuprofen  Diet:  General thin liquids Activity:   Bathroom privileges with assistance DVT Prophylaxis:  ambulatory  CLINICALLY SIGNIFICANT STUDIES Basic Metabolic Panel:  Recent Labs Lab  01/08/13 2022  NA 137  K 4.1  CL 101  CO2 28  GLUCOSE 96  BUN 9  CREATININE 0.70  CALCIUM 9.3   Liver Function Tests:  Recent Labs Lab 01/08/13 2022  AST 13  ALT 11  ALKPHOS 53  BILITOT 0.2*  PROT 6.7  ALBUMIN 3.9   CBC:  Recent Labs Lab 01/08/13 2022  WBC 8.9  NEUTROABS 5.1  HGB 12.6  HCT 35.9*  MCV 85.3  PLT 255   Coagulation: No results found for this basename: LABPROT, INR,  in the last 168 hours Cardiac Enzymes: No results found for this basename: CKTOTAL, CKMB, CKMBINDEX, TROPONINI,  in the last 168 hours Urinalysis:  Recent Labs Lab 01/08/13 2021  COLORURINE YELLOW  LABSPEC 1.005  PHURINE 7.5  GLUCOSEU NEGATIVE  HGBUR MODERATE*  BILIRUBINUR NEGATIVE  KETONESUR NEGATIVE  PROTEINUR NEGATIVE  UROBILINOGEN 0.2  NITRITE NEGATIVE  LEUKOCYTESUR NEGATIVE   Lipid Panel    Component Value Date/Time   CHOL 180 01/09/2013 0500   TRIG 61 01/09/2013 0500   HDL 90 01/09/2013 0500   CHOLHDL 2.0 01/09/2013 0500   VLDL 12 01/09/2013 0500   LDLCALC 78 01/09/2013 0500   HgbA1C  No results found for this basename: HGBA1C    Urine Drug Screen:     Component Value Date/Time   LABOPIA NONE DETECTED 01/09/2013 0600   COCAINSCRNUR NONE DETECTED 01/09/2013 0600   LABBENZ NONE  DETECTED 01/09/2013 0600   AMPHETMU NONE DETECTED 01/09/2013 0600   THCU NONE DETECTED 01/09/2013 0600   LABBARB NONE DETECTED 01/09/2013 0600    Alcohol Level: No results found for this basename: ETH,  in the last 168 hours  CT of the brain  01/08/2013   Normal study.     MRI of the brain  01/09/2013    Normal noncontrast MRI appearance of the brain.  MRA of the brain  01/09/2013   Negative intracranial MRA.  2D Echocardiogram    Carotid Doppler  No evidence of hemodynamically significant internal carotid artery stenosis. Vertebral artery flow is antegrade.   CXR    EKG  normal EKG, normal sinus rhythm.   Therapy Recommendations no therapy needs  Physical Exam   Pleasant middle aged [place] lady not  in distress.Awake alert. Afebrile. Head is nontraumatic. Neck is supple without bruit. Hearing is normal. Cardiac exam no murmur or gallop. Lungs are clear to auscultation. Distal pulses are well felt. Neurological Exam ;  Awake  Alert oriented x 3. Normal speech and language.eye movements full without nystagmus.fundi were not visualized. Vision acuity and fields appear normal. Hearing is normal. Palatal movements are normal. Face symmetric. Tongue midline. Normal strength, tone, reflexes and coordination. Normal sensation. Gait deferred. ASSESSMENT Janet Carney is a 42 y.o. female presenting with acute onset left hand numbness, difficulty talking, and headache.  Imaging confirms no acute infarct. Dx:  Likely migraine; no stroke or TIA diagnoses.  On no antithrombotics prior to admission. Now on aspirin 325 mg orally every day for secondary stroke prevention. Patient with no resultant neuro deficits.   Hospital day # 1  TREATMENT/PLAN  Discontinue aspirin. No indication for ongoing antiplatelets  Can f/u 2D echo as an OP  Follow up with Dr. Pearlean Brownie should symptoms recur, otherwise, no nerve followup recommended. No further stroke workup indicated. Stroke Service will sign off. Please call should any needs arise.  Annie Main, MSN, RN, ANVP-BC, ANP-BC, [facility] Pager: [phone] 01/09/2013 10:01 AM  I have personally obtained a history, examined the patient, evaluated imaging results, and formulated the assessment and plan of care. I agree with the above.  Delia Heady, MD

## 2013-01-09 NOTE — H&P (Signed)
[facility] History and Physical  Raeley Gilmore YNW:[id number] DOB: [birth year]    PCP:   None.  Chief Complaint: Difficult speaking and left hand numbness.  HPI: Janet Carney is an 42 y.o. female with benign PMH on no chronic medication, Hx of bitemporal HA, presents to the ER with hx of bitemperal HA with blurry peripheral Vx, and about 15 minutes of expressive aphasia.   She also has left hand numbness as well.  No prior similar symptoms, and no "warning signs".  She is not on oral contraceptives, or ERT, and denied drug, tobacco, or alcohol use.  She is asymptomatic in the ER.  Work up in the ER included a negative head CT, and normal serologies.  [facility] was consulted and recommended full TIA work up.  Hospitalist was asked to admit for complete work up.  Rewiew of Systems:  Constitutional: Negative for malaise, fever and chills. No significant weight loss or weight gain Eyes: Negative for eye pain, redness and discharge, diplopia, or flashes of light. ENMT: Negative for ear pain, hoarseness, nasal congestion, sinus pressure and sore throat. No headaches; tinnitus, drooling, or problem swallowing. Cardiovascular: Negative for chest pain, palpitations, diaphoresis, dyspnea and peripheral edema. ; No orthopnea, PND Respiratory: Negative for cough, hemoptysis, wheezing and stridor. No pleuritic chestpain. Gastrointestinal: Negative for nausea, vomiting, diarrhea, constipation, abdominal pain, melena, blood in stool, hematemesis, jaundice and rectal bleeding.    Genitourinary: Negative for frequency, dysuria, incontinence,flank pain and hematuria; Musculoskeletal: Negative for back pain and neck pain. Negative for swelling and trauma.;  Skin: . Negative for pruritus, rash, abrasions, bruising and skin lesion.; ulcerations Neuro: Negative for  lightheadedness and neck stiffness. Negative for weakness, altered level of consciousness , altered mental status, extremity weakness,  burning feet, involuntary movement, seizure and syncope.  Psych: negative for anxiety, depression, insomnia, tearfulness, panic attacks, hallucinations, paranoia, suicidal or homicidal ideation    History reviewed. No pertinent past medical history.  Past Surgical History  Procedure Laterality Date  . Breast biopsy  2002    Medications:  HOME MEDS: Prior to Admission medications   Medication Sig Start Date End Date Taking? Authorizing Provider  ibuprofen (ADVIL,MOTRIN) 200 MG tablet Take 200 mg by mouth every 6 (six) hours as needed for pain or headache.   Yes Historical Provider, MD     Allergies:  No Known Allergies  Social History:   reports that she has never smoked. She has never used smokeless tobacco. She reports that she does not drink alcohol or use illicit drugs.  Family History: Family History  Problem Relation Age of Onset  . Lung cancer Father   . Cancer Father     lung cancer  . Diabetes Father      Physical Exam: Filed Vitals:   01/08/13 2012 01/08/13 2336 01/09/13 0001  BP: 128/72  122/72  Pulse: 69  50  Temp: 98.3 F (36.8 C) 98.3 F (36.8 C)   TempSrc: Oral    Resp: 14  16  SpO2: 98%  99%   Blood pressure 122/72, pulse 50, temperature 98.3 F (36.8 C), temperature source Oral, resp. rate 16, last menstrual period 12/18/2012, SpO2 99.00%.  GEN:  Pleasant  patient lying in the stretcher in no acute distress; cooperative with exam. PSYCH:  alert and oriented x4; does not appear anxious or depressed; affect is appropriate. HEENT: Mucous membranes pink and anicteric; PERRLA; EOM intact; no cervical lymphadenopathy nor thyromegaly or carotid bruit; no JVD; There were no stridor. Neck is very  supple. Breasts:: Not examined CHEST WALL: No tenderness CHEST: Normal respiration, clear to auscultation bilaterally.  HEART: Regular rate and rhythm.  There are no murmur, rub, or gallops.   BACK: No kyphosis or scoliosis; no CVA tenderness ABDOMEN: soft  and non-tender; no masses, no organomegaly, normal abdominal bowel sounds; no pannus; no intertriginous candida. There is no rebound and no distention. Rectal Exam: Not done EXTREMITIES: No bone or joint deformity; age-appropriate arthropathy of the hands and knees; no edema; no ulcerations.  There is no calf tenderness. Genitalia: not examined PULSES: 2+ and symmetric SKIN: Normal hydration no rash or ulceration CNS: Cranial nerves 2-12 grossly intact no focal lateralizing neurologic deficit.  Speech is fluent; uvula elevated with phonation, facial symmetry and tongue midline. DTR are normal bilaterally, cerebella exam is intact, barbinski is negative and strengths are equaled bilaterally.  No sensory loss. Visual field is full to confrontation.   Labs on Admission:  Basic Metabolic Panel:  Recent Labs Lab 01/08/13 2022  NA 137  K 4.1  CL 101  CO2 28  GLUCOSE 96  BUN 9  CREATININE 0.70  CALCIUM 9.3   Liver Function Tests:  Recent Labs Lab 01/08/13 2022  AST 13  ALT 11  ALKPHOS 53  BILITOT 0.2*  PROT 6.7  ALBUMIN 3.9   No results found for this basename: LIPASE, AMYLASE,  in the last 168 hours No results found for this basename: AMMONIA,  in the last 168 hours CBC:  Recent Labs Lab 01/08/13 2022  WBC 8.9  NEUTROABS 5.1  HGB 12.6  HCT 35.9*  MCV 85.3  PLT 255   Cardiac Enzymes: No results found for this basename: CKTOTAL, CKMB, CKMBINDEX, TROPONINI,  in the last 168 hours  CBG: No results found for this basename: GLUCAP,  in the last 168 hours   Radiological Exams on Admission: Ct Head Wo Contrast  01/08/2013   *RADIOLOGY REPORT*  Clinical Data: Weakness, palpitations.  CT HEAD WITHOUT CONTRAST  Technique:  Contiguous axial images were obtained from the base of the skull through the vertex without contrast.  Comparison: None.  Findings: No acute intracranial abnormality.  Specifically, no hemorrhage, hydrocephalus, mass lesion, acute infarction, or significant  intracranial injury.  No acute calvarial abnormality. Visualized paranasal sinuses and mastoids clear.  Orbital soft tissues unremarkable.  IMPRESSION: Normal study.   Original Report Authenticated By: Charlett Nose, M.D.   Assessment/Plan Present on Admission:  . Migraine syndrome   PLAN:  I suspect this is complex migraine, but can't exclude TIA.  Neuro has been consulted and recommended TIA work up.  These tests have been ordered.  She has been given an ASA and started on daily ASA 81mg .  She is stable, full code, and will be admitted to [facility] service.    Other plans as per orders.  Code Status: FULL Unk Lightning, MD. [facility] Pager [phone] 7pm to 7am.  01/09/2013, 12:46 AM

## 2013-01-09 NOTE — Progress Notes (Signed)
Bilateral carotid artery duplex:  Less than 39% ICA stenosis.  Vertebral artery flow is antegrade.     

## 2013-06-07 ENCOUNTER — Other Ambulatory Visit (HOSPITAL_COMMUNITY)
Admission: RE | Admit: 2013-06-07 | Discharge: 2013-06-07 | Disposition: A | Discharge status: Home / Self Care | Payer: BC Managed Care – PPO | Source: Ambulatory Visit | Attending: Gynecology | Admitting: Gynecology

## 2013-06-07 ENCOUNTER — Ambulatory Visit (INDEPENDENT_AMBULATORY_CARE_PROVIDER_SITE_OTHER): Payer: BC Managed Care – PPO | Admitting: Women's Health

## 2013-06-07 ENCOUNTER — Encounter: Payer: Self-pay | Admitting: Women's Health

## 2013-06-07 VITALS — BP 114/64 | Ht 64.0 in | Wt 139.4 lb

## 2013-06-07 DIAGNOSIS — Z01419 Encounter for gynecological examination (general) (routine) without abnormal findings: Secondary | ICD-10-CM | POA: Diagnosis not present

## 2013-06-07 DIAGNOSIS — Z833 Family history of diabetes mellitus: Secondary | ICD-10-CM

## 2013-06-07 DIAGNOSIS — Z13220 Encounter for screening for lipoid disorders: Secondary | ICD-10-CM

## 2013-06-07 DIAGNOSIS — N926 Irregular menstruation, unspecified: Secondary | ICD-10-CM

## 2013-06-07 DIAGNOSIS — Z733 Stress, not elsewhere classified: Secondary | ICD-10-CM

## 2013-06-07 DIAGNOSIS — F439 Reaction to severe stress, unspecified: Secondary | ICD-10-CM | POA: Insufficient documentation

## 2013-06-07 LAB — PROLACTIN: Prolactin: 9 ng/mL
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
## 2013-06-07 NOTE — Patient Instructions (Signed)

## 2013-06-08 LAB — URINALYSIS W MICROSCOPIC + REFLEX CULTURE
Bilirubin Urine: NEGATIVE
Casts: NONE SEEN
Glucose, UA: NEGATIVE mg/dL
Ketones, ur: NEGATIVE mg/dL
Leukocytes, UA: NEGATIVE
Protein, ur: NEGATIVE mg/dL
pH: 5 (ref 5.0–8.0)

## 2013-08-08 DIAGNOSIS — K589 Irritable bowel syndrome without diarrhea: Secondary | ICD-10-CM | POA: Insufficient documentation

## 2013-12-12 ENCOUNTER — Telehealth: Payer: Self-pay | Admitting: *Deleted

## 2013-12-12 NOTE — Telephone Encounter (Signed)
Message copied by Aura Camps on Wed Dec 12, 2013  3:27 PM ------      Message from: Jerilynn Mages      Created: Wed Dec 12, 2013  3:04 PM      Regarding: nurse call      Contact: [phone]       Patient call saying she has had bleeding for about 20 days now with some pain.  Back in Dec 2014 Harriett Sine had ordered a sonohysterogram but patient never scheduled that. Her LMP 12-10-13 so sonohysterogram needs to be next week but we have nothing with TF or JF. Please ask Harriett Sine what she wants [place] to do? Does she want to see patient again to revaluate?  Does she still want sonohysterogram?  Patient expecting call back. Thanks ------

## 2013-12-12 NOTE — Telephone Encounter (Signed)
Telephone call, cycles lasting anywhere from 8-20 days, have been longer than usual since November. Instructed to check a urine pregnancy test, if negative will call office will take Provera for 10 days and get sonohysterogram scheduled with Dr. Audie Box. Uses condoms infrequent intercourse.

## 2013-12-12 NOTE — Telephone Encounter (Signed)
Please see the below. 

## 2013-12-13 ENCOUNTER — Other Ambulatory Visit: Payer: Self-pay | Admitting: Women's Health

## 2013-12-13 MED ORDER — MEDROXYPROGESTERONE ACETATE 10 MG PO TABS: 10.0000 mg | ORAL_TABLET | Freq: Every day | ORAL | Status: DC

## 2013-12-13 NOTE — Telephone Encounter (Signed)
Telephone call, urine pregnancy test negative, will take Provera 10 for 10 days and schedule sonohysterogram with Dr. Audie Box after bleeding stops, instructed to call if bleeding does not stop with Provera.

## 2013-12-24 ENCOUNTER — Telehealth: Payer: Self-pay | Admitting: *Deleted

## 2013-12-24 NOTE — Telephone Encounter (Signed)
Pt informed, transferred to front desk 

## 2013-12-24 NOTE — Telephone Encounter (Signed)
Needs office visit for exam and evaluation.

## 2013-12-24 NOTE — Telephone Encounter (Signed)
(   you are back up MD) Pt calling to follow up from telephone encounter 12/12/13 pt took all provera and still bleeding more so heavy now. Pt changing pads every 1 hour. Pt has SHGM on 01/11/14. Please advise

## 2013-12-26 ENCOUNTER — Ambulatory Visit (INDEPENDENT_AMBULATORY_CARE_PROVIDER_SITE_OTHER): Payer: BC Managed Care – PPO | Admitting: Gynecology

## 2013-12-26 ENCOUNTER — Encounter: Payer: Self-pay | Admitting: Gynecology

## 2013-12-26 DIAGNOSIS — N926 Irregular menstruation, unspecified: Secondary | ICD-10-CM

## 2013-12-26 MED ORDER — MEGESTROL ACETATE 20 MG PO TABS: 20.0000 mg | ORAL_TABLET | Freq: Every day | ORAL | Status: DC

## 2013-12-26 NOTE — Patient Instructions (Signed)
Followup for ultrasound as scheduled. Start the Megace medication twice daily for several days then once daily through the ultrasound appointment. Followup if bleeding continues.

## 2013-12-26 NOTE — Progress Notes (Signed)
Janet Carney March 27, 1971 [id number]        43 y.o.  [id number] presents complaining of continued irregular bleeding. Had seen Janet Carney in December with prolonged irregular bleeding. Was to schedule a sonohysterogram with me but was "busy" and hasn't scheduled July 10. She's been bleeding on and off over the last several weeks. Took a course of Provera provided by Janet Carney 10 mg x10 days but has continued to bleed despite this. She had a normal prolactin and TSH in December. Not actively contracepting.  Past medical history,surgical history, problem list, medications, allergies, family history and social history were all reviewed and documented in the EPIC chart.  Directed ROS with pertinent positives and negatives documented in the history of present illness/assessment and plan.  Exam: Janet Carney assistant General appearance  Normal Abdomen soft nontender without masses guarding rebound Pelvic external BUS vagina with light menses flow. Cervix normal. Uterus grossly normal midline mobile nontender. Adnexa without masses or tenderness.  Assessment/Plan:  43 y.o. [id number]  With prolonged irregular bleeding to include bleeding for up to 4 weeks straight. Progesterone withdrawal unsuccessful to stop the bleeding. TSH prolactin normal in December. Check hCG and FSH today. Treat with Megace 20 mg twice a day times several days then daily through her  sonohysterogram appointment. Abstinence through the ultrasound.   Note: This document was prepared with digital dictation and possible smart phrase technology. Any transcriptional errors that result from this process are unintentional.   Janet Carney,Janet Show P MD, 10:47 AM 12/26/2013

## 2013-12-27 LAB — HCG, SERUM, QUALITATIVE: Preg, Serum: NEGATIVE

## 2013-12-27 LAB — FOLLICLE STIMULATING HORMONE: FSH: 54.1 m[IU]/mL

## 2014-01-07 ENCOUNTER — Other Ambulatory Visit: Payer: Self-pay | Admitting: Gynecology

## 2014-01-07 DIAGNOSIS — N926 Irregular menstruation, unspecified: Secondary | ICD-10-CM

## 2014-01-11 ENCOUNTER — Ambulatory Visit: Payer: BC Managed Care – PPO | Admitting: Gynecology

## 2014-01-11 ENCOUNTER — Other Ambulatory Visit: Payer: BC Managed Care – PPO

## 2014-02-08 ENCOUNTER — Ambulatory Visit: Payer: BC Managed Care – PPO | Admitting: Gynecology

## 2014-02-08 ENCOUNTER — Other Ambulatory Visit: Payer: BC Managed Care – PPO

## 2014-05-06 ENCOUNTER — Encounter: Payer: Self-pay | Admitting: Gynecology

## 2014-07-23 ENCOUNTER — Encounter: Payer: Self-pay | Admitting: Women's Health

## 2014-07-23 ENCOUNTER — Ambulatory Visit (INDEPENDENT_AMBULATORY_CARE_PROVIDER_SITE_OTHER): Payer: BLUE CROSS/BLUE SHIELD | Admitting: Women's Health

## 2014-07-23 VITALS — BP 110/70 | Wt 142.0 lb

## 2014-07-23 DIAGNOSIS — N3000 Acute cystitis without hematuria: Secondary | ICD-10-CM

## 2014-07-23 DIAGNOSIS — R350 Frequency of micturition: Secondary | ICD-10-CM

## 2014-07-23 LAB — URINALYSIS W MICROSCOPIC + REFLEX CULTURE
Bilirubin Urine: NEGATIVE
CASTS: NONE SEEN
CRYSTALS: NONE SEEN
Glucose, UA: NEGATIVE mg/dL
KETONES UR: NEGATIVE mg/dL
Leukocytes, UA: NEGATIVE
NITRITE: NEGATIVE
PROTEIN: NEGATIVE mg/dL
Specific Gravity, Urine: <1.005 — ABNORMAL LOW (ref 1.005–1.030)
UROBILINOGEN UA: 0.2 mg/dL (ref 0.0–1.0)
pH: 5.5 (ref 5.0–8.0)

## 2014-07-23 NOTE — Patient Instructions (Signed)

## 2014-07-23 NOTE — Progress Notes (Signed)
Patient ID: Ocie BobHomeyra Carney, female   DOB: [birth year], 44 y.o.   MRN: [id number] Presents with complaint of questionable UTI. Had increased frequency, pain and burning with urination, much better after drinking cranberry juice and taking Azo. Denies vaginal discharge, abdominal pain, fever and having minimal back pain.   Exam: Appears well. External genitalia within normal limits, speculum exam scant clear discharge no odor or erythema noted. Bimanual no CMT or adnexal fullness or tenderness. No CVAT discomfort mostly sacral area. UA: Moderate blood, 0-2 WBCs, 3-6 RBCs, rare bacteria.  Urinary frequency  Plan: Urine culture pending. Continue Azo as needed.

## 2014-07-25 ENCOUNTER — Telehealth: Payer: Self-pay | Admitting: *Deleted

## 2014-07-25 LAB — URINE CULTURE

## 2014-07-25 MED ORDER — SULFAMETHOXAZOLE-TRIMETHOPRIM 800-160 MG PO TABS: 1.0000 | ORAL_TABLET | Freq: Two times a day (BID) | ORAL | Status: DC

## 2014-07-25 NOTE — Telephone Encounter (Signed)
Best to continue using AZO, also call in Septra twice daily for 3 days #6 if symptoms persist or increase. Only use antibiotic if needed.

## 2014-07-25 NOTE — Telephone Encounter (Signed)
-----   Message from Harrington ChallengerNancy J Young, NP sent at 07/25/2014  7:55 AM EST ----- Please call and  inform urine culture is negative, no need for medication.

## 2014-07-25 NOTE — Telephone Encounter (Signed)
Pt informed with the below note, pt said last night she had burning with urination. Asked for recommendations, states it not vaginal burning, but actually burning after using the bathroom. Please advise

## 2014-07-25 NOTE — Telephone Encounter (Signed)
Pt informed with the below note. 

## 2014-11-26 ENCOUNTER — Other Ambulatory Visit: Payer: Self-pay | Admitting: Women's Health

## 2014-11-26 DIAGNOSIS — Z1231 Encounter for screening mammogram for malignant neoplasm of breast: Secondary | ICD-10-CM

## 2014-12-09 ENCOUNTER — Ambulatory Visit (HOSPITAL_COMMUNITY)
Admission: RE | Admit: 2014-12-09 | Discharge: 2014-12-09 | Disposition: A | Discharge status: Home / Self Care | Payer: BLUE CROSS/BLUE SHIELD | Source: Ambulatory Visit | Attending: Women's Health | Admitting: Women's Health

## 2014-12-09 DIAGNOSIS — Z1231 Encounter for screening mammogram for malignant neoplasm of breast: Secondary | ICD-10-CM | POA: Insufficient documentation

## 2015-12-23 ENCOUNTER — Encounter: Payer: BLUE CROSS/BLUE SHIELD | Admitting: Women's Health

## 2016-01-22 ENCOUNTER — Encounter: Payer: BLUE CROSS/BLUE SHIELD | Admitting: Women's Health

## 2016-02-04 ENCOUNTER — Encounter: Payer: Self-pay | Admitting: Women's Health

## 2016-02-04 ENCOUNTER — Ambulatory Visit (INDEPENDENT_AMBULATORY_CARE_PROVIDER_SITE_OTHER): Payer: BLUE CROSS/BLUE SHIELD | Admitting: Women's Health

## 2016-02-04 VITALS — BP 112/76 | Ht 64.0 in | Wt 147.6 lb

## 2016-02-04 DIAGNOSIS — Z13220 Encounter for screening for lipoid disorders: Secondary | ICD-10-CM | POA: Diagnosis not present

## 2016-02-04 DIAGNOSIS — Z01419 Encounter for gynecological examination (general) (routine) without abnormal findings: Secondary | ICD-10-CM

## 2016-02-04 DIAGNOSIS — Z1329 Encounter for screening for other suspected endocrine disorder: Secondary | ICD-10-CM

## 2016-02-04 LAB — CBC WITH DIFFERENTIAL/PLATELET
BASOS PCT: 1 %
Basophils Absolute: 62 cells/uL (ref 0–200)
EOS ABS: 186 {cells}/uL (ref 15–500)
Eosinophils Relative: 3 %
HCT: 37.2 % (ref 35.0–45.0)
Hemoglobin: 12.3 g/dL (ref 11.7–15.5)
Lymphocytes Relative: 41 %
Lymphs Abs: 2542 cells/uL (ref 850–3900)
MCH: 28.9 pg (ref 27.0–33.0)
MCHC: 33.1 g/dL (ref 32.0–36.0)
MCV: 87.5 fL (ref 80.0–100.0)
MONOS PCT: 9 %
MPV: 9 fL (ref 7.5–12.5)
Monocytes Absolute: 558 cells/uL (ref 200–950)
NEUTROS ABS: 2852 {cells}/uL (ref 1500–7800)
Neutrophils Relative %: 46 %
PLATELETS: 278 10*3/uL (ref 140–400)
RBC: 4.25 MIL/uL (ref 3.80–5.10)
RDW: 12.5 % (ref 11.0–15.0)
WBC: 6.2 10*3/uL (ref 3.8–10.8)

## 2016-02-04 LAB — COMPREHENSIVE METABOLIC PANEL
ALK PHOS: 57 U/L (ref 33–115)
ALT: 12 U/L (ref 6–29)
AST: 16 U/L (ref 10–35)
Albumin: 4 g/dL (ref 3.6–5.1)
BILIRUBIN TOTAL: 0.4 mg/dL (ref 0.2–1.2)
BUN: 10 mg/dL (ref 7–25)
CO2: 25 mmol/L (ref 20–31)
CREATININE: 0.78 mg/dL (ref 0.50–1.10)
Calcium: 8.9 mg/dL (ref 8.6–10.2)
Chloride: 104 mmol/L (ref 98–110)
GLUCOSE: 66 mg/dL (ref 65–99)
POTASSIUM: 3.9 mmol/L (ref 3.5–5.3)
SODIUM: 140 mmol/L (ref 135–146)
TOTAL PROTEIN: 6.5 g/dL (ref 6.1–8.1)

## 2016-02-04 LAB — LIPID PANEL
CHOLESTEROL: 210 mg/dL — AB (ref 125–200)
HDL: 90 mg/dL (ref >=46)
LDL Cholesterol: 98 mg/dL (ref <130)
Total CHOL/HDL Ratio: 2.3 Ratio (ref <=5.0)
Triglycerides: 110 mg/dL (ref <150)
VLDL: 22 mg/dL (ref <30)

## 2016-02-04 LAB — TSH: TSH: 0.95 m[IU]/L

## 2016-02-04 NOTE — Addendum Note (Signed)
Addended by: Aura Camps on: 02/04/2016 12:56 PM   Modules accepted: Orders

## 2016-02-04 NOTE — Progress Notes (Signed)
Janet Carney October 13, 1970 [id number]    History:    Presents for annual exam.  Postmenopausal no bleeding for over 2 years, tolerating  menopausal symptoms on no HRT. FSH 54 in 2014, at  time of last annual.  Normal Pap and mammogram history. History of a negative breast biopsy in 2002.  Past medical history, past surgical history, family history and social history were all reviewed and documented in the EPIC chart. Production designer, theatre/television/film at [employer]. Daughter 40, son 69 both doing well. Father and 2 siblings diabetes.  ROS:  A ROS was performed and pertinent positives and negatives are included.  Exam:  Vitals:   02/04/16 1201  BP: 112/76     General appearance:  Normal Thyroid:  Symmetrical, normal in size, without palpable masses or nodularity. Respiratory  Auscultation:  Clear without wheezing or rhonchi Cardiovascular  Auscultation:  Regular rate, without rubs, murmurs or gallops  Edema/varicosities:  Not grossly evident Abdominal  Soft,nontender, without masses, guarding or rebound.  Liver/spleen:  No organomegaly noted  Hernia:  None appreciated  Skin  Inspection:  Grossly normal   Breasts: Examined lying and sitting.     Right: Without masses, retractions, discharge or axillary adenopathy.     Left: Without masses, retractions, discharge or axillary adenopathy. Gentitourinary   Inguinal/mons:  Normal without inguinal adenopathy  External genitalia:  Normal  BUS/Urethra/Skene's glands:  Normal  Vagina:  Normal  Cervix:  Normal  Uterus:   normal in size, shape and contour.  Midline and mobile  Adnexa/parametria:     Rt: Without masses or tenderness.   Lt: Without masses or tenderness.  Anus and perineum: Normal  Digital rectal exam: Normal sphincter tone without palpated masses or tenderness  Assessment/Plan:  45 y.o. M WF G2 P2  for annual exam with intermittent low back pain.  Postmenopausal/no bleeding/no HRT Low back pain  Plan: Reviewed importance of avoiding heavy  lifting, wearing back support belt/ brace when at work, states had much less back discomfort when wore in the past for lifting but states since menopausal causes more hot flashes. Instructed to consult with orthopedic if back pain persists after wearing back brace. HRT reviewed and declines. SBE's, instructed to schedule mammogram, 3-D tomography reviewed and encouraged history of dense breasts. Continue active lifestyle and healthy diet, vitamin D 1000 daily encouraged. CBC, vitamin D, lipid panel, CMP, UA, Pap with HR HPV typing, new screening guidelines reviewed.    Harrington Challenger [facility], 12:40 PM 02/04/2016

## 2016-02-04 NOTE — Patient Instructions (Signed)

## 2016-02-05 LAB — PAP, TP IMAGING W/ HPV RNA, RFLX HPV TYPE 16,18/45: HPV MRNA, HIGH RISK: NOT DETECTED

## 2016-02-05 LAB — URINALYSIS W MICROSCOPIC + REFLEX CULTURE
Bacteria, UA: NONE SEEN [HPF]
Bilirubin Urine: NEGATIVE
CASTS: NONE SEEN [LPF]
Crystals: NONE SEEN [HPF]
GLUCOSE, UA: NEGATIVE
HGB URINE DIPSTICK: NEGATIVE
KETONES UR: NEGATIVE
LEUKOCYTES UA: NEGATIVE
NITRITE: NEGATIVE
PH: 6 (ref 5.0–8.0)
Protein, ur: NEGATIVE
RBC / HPF: NONE SEEN RBC/HPF (ref <=2)
SPECIFIC GRAVITY, URINE: 1.004 (ref 1.001–1.035)
SQUAMOUS EPITHELIAL / LPF: NONE SEEN [HPF] (ref <=5)
WBC, UA: NONE SEEN WBC/HPF (ref <=5)
Yeast: NONE SEEN [HPF]

## 2016-02-05 LAB — VITAMIN D 25 HYDROXY (VIT D DEFICIENCY, FRACTURES): Vit D, 25-Hydroxy: 30 ng/mL (ref 30–100)

## 2016-04-02 ENCOUNTER — Other Ambulatory Visit: Payer: Self-pay | Admitting: Women's Health

## 2016-04-02 DIAGNOSIS — Z1231 Encounter for screening mammogram for malignant neoplasm of breast: Secondary | ICD-10-CM

## 2016-04-29 ENCOUNTER — Ambulatory Visit
Admission: RE | Admit: 2016-04-29 | Discharge: 2016-04-29 | Disposition: A | Discharge status: Home / Self Care | Payer: BLUE CROSS/BLUE SHIELD | Source: Ambulatory Visit | Attending: Women's Health | Admitting: Women's Health

## 2016-04-29 DIAGNOSIS — Z1231 Encounter for screening mammogram for malignant neoplasm of breast: Secondary | ICD-10-CM

## 2017-02-10 ENCOUNTER — Encounter: Payer: BLUE CROSS/BLUE SHIELD | Admitting: Women's Health

## 2017-02-14 ENCOUNTER — Encounter: Payer: BLUE CROSS/BLUE SHIELD | Admitting: Women's Health

## 2017-03-29 ENCOUNTER — Encounter: Payer: BLUE CROSS/BLUE SHIELD | Admitting: Women's Health

## 2017-05-02 ENCOUNTER — Encounter: Payer: Self-pay | Admitting: Women's Health

## 2017-05-02 ENCOUNTER — Ambulatory Visit (INDEPENDENT_AMBULATORY_CARE_PROVIDER_SITE_OTHER): Payer: BLUE CROSS/BLUE SHIELD | Admitting: Women's Health

## 2017-05-02 VITALS — BP 122/80

## 2017-05-02 DIAGNOSIS — Z79890 Hormone replacement therapy (postmenopausal): Secondary | ICD-10-CM | POA: Diagnosis not present

## 2017-05-02 DIAGNOSIS — Z01419 Encounter for gynecological examination (general) (routine) without abnormal findings: Secondary | ICD-10-CM | POA: Diagnosis not present

## 2017-05-02 MED ORDER — PROGESTERONE MICRONIZED 200 MG PO CAPS: ORAL_CAPSULE | ORAL | 4 refills | Status: DC

## 2017-05-02 MED ORDER — PROGESTERONE MICRONIZED 200 MG PO CAPS: 200.0000 mg | ORAL_CAPSULE | Freq: Every day | ORAL | 4 refills | Status: DC

## 2017-05-02 MED ORDER — ESTRADIOL 0.05 MG/24HR TD PTWK: 0.0500 mg | MEDICATED_PATCH | TRANSDERMAL | 4 refills | Status: DC

## 2017-05-02 NOTE — Patient Instructions (Addendum)
[facility] [phone] Health Maintenance for Postmenopausal Women Menopause is a normal process in which your reproductive ability comes to an end. This process happens gradually over a span of months to years, usually between the ages of 48 and 55. Menopause is complete when you have missed 12 consecutive menstrual periods. It is important to talk with your health care provider about some of the most common conditions that affect postmenopausal women, such as heart disease, cancer, and bone loss (osteoporosis). Adopting a healthy lifestyle and getting preventive care can help to promote your health and wellness. Those actions can also lower your chances of developing some of these common conditions. What should I know about menopause? During menopause, you may experience a number of symptoms, such as:  Moderate-to-severe hot flashes.  Night sweats.  Decrease in sex drive.  Mood swings.  Headaches.  Tiredness.  Irritability.  Memory problems.  Insomnia.  Choosing to treat or not to treat menopausal changes is an individual decision that you make with your health care provider. What should I know about hormone replacement therapy and supplements? Hormone therapy products are effective for treating symptoms that are associated with menopause, such as hot flashes and night sweats. Hormone replacement carries certain risks, especially as you become older. If you are thinking about using estrogen or estrogen with progestin treatments, discuss the benefits and risks with your health care provider. What should I know about heart disease and stroke? Heart disease, heart attack, and stroke become more likely as you age. This may be due, in part, to the hormonal changes that your body experiences during menopause. These can affect how your body processes dietary fats, triglycerides, and cholesterol. Heart attack and stroke are both medical emergencies. There are many things that you can do to  help prevent heart disease and stroke:  Have your blood pressure checked at least every 1-2 years. High blood pressure causes heart disease and increases the risk of stroke.  If you are 55-79 years old, ask your health care provider if you should take aspirin to prevent a heart attack or a stroke.  Do not use any tobacco products, including cigarettes, chewing tobacco, or electronic cigarettes. If you need help quitting, ask your health care provider.  It is important to eat a healthy diet and maintain a healthy weight. ? Be sure to include plenty of vegetables, fruits, low-fat dairy products, and lean protein. ? Avoid eating foods that are high in solid fats, added sugars, or salt (sodium).  Get regular exercise. This is one of the most important things that you can do for your health. ? Try to exercise for at least 150 minutes each week. The type of exercise that you do should increase your heart rate and make you sweat. This is known as moderate-intensity exercise. ? Try to do strengthening exercises at least twice each week. Do these in addition to the moderate-intensity exercise.  Know your numbers.Ask your health care provider to check your cholesterol and your blood glucose. Continue to have your blood tested as directed by your health care provider.  What should I know about cancer screening? There are several types of cancer. Take the following steps to reduce your risk and to catch any cancer development as early as possible. Breast Cancer  Practice breast self-awareness. ? This means understanding how your breasts normally appear and feel. ? It also means doing regular breast self-exams. Let your health care provider know about any changes, no matter how small.  If   If you are 40 or older, have a clinician do a breast exam (clinical breast exam or CBE) every year. Depending on your age, family history, and medical history, it may be recommended that you also have a yearly breast  X-ray (mammogram).  If you have a family history of breast cancer, talk with your health care provider about genetic screening.  If you are at high risk for breast cancer, talk with your health care provider about having an MRI and a mammogram every year.  Breast cancer (BRCA) gene test is recommended for women who have family members with BRCA-related cancers. Results of the assessment will determine the need for genetic counseling and BRCA1 and for BRCA2 testing. BRCA-related cancers include these types: ? Breast. This occurs in males or females. ? Ovarian. ? Tubal. This may also be called fallopian tube cancer. ? Cancer of the abdominal or pelvic lining (peritoneal cancer). ? Prostate. ? Pancreatic.  Cervical, Uterine, and Ovarian Cancer Your health care provider may recommend that you be screened regularly for cancer of the pelvic organs. These include your ovaries, uterus, and vagina. This screening involves a pelvic exam, which includes checking for microscopic changes to the surface of your cervix (Pap test).  For women ages 21-65, health care providers may recommend a pelvic exam and a Pap test every three years. For women ages 30-65, they may recommend the Pap test and pelvic exam, combined with testing for human papilloma virus (HPV), every five years. Some types of HPV increase your risk of cervical cancer. Testing for HPV may also be done on women of any age who have unclear Pap test results.  Other health care providers may not recommend any screening for nonpregnant women who are considered low risk for pelvic cancer and have no symptoms. Ask your health care provider if a screening pelvic exam is right for you.  If you have had past treatment for cervical cancer or a condition that could lead to cancer, you need Pap tests and screening for cancer for at least 20 years after your treatment. If Pap tests have been discontinued for you, your risk factors (such as having a new sexual  partner) need to be reassessed to determine if you should start having screenings again. Some women have medical problems that increase the chance of getting cervical cancer. In these cases, your health care provider may recommend that you have screening and Pap tests more often.  If you have a family history of uterine cancer or ovarian cancer, talk with your health care provider about genetic screening.  If you have vaginal bleeding after reaching menopause, tell your health care provider.  There are currently no reliable tests available to screen for ovarian cancer.  Lung Cancer Lung cancer screening is recommended for adults 55-80 years old who are at high risk for lung cancer because of a history of smoking. A yearly low-dose CT scan of the lungs is recommended if you:  Currently smoke.  Have a history of at least 30 pack-years of smoking and you currently smoke or have quit within the past 15 years. A pack-year is smoking an average of one pack of cigarettes per day for one year.  Yearly screening should:  Continue until it has been 15 years since you quit.  Stop if you develop a health problem that would prevent you from having lung cancer treatment.  Colorectal Cancer  This type of cancer can be detected and can often be prevented.  Routine colorectal cancer   screening usually begins at age 50 and continues through age 75.  If you have risk factors for colon cancer, your health care provider may recommend that you be screened at an earlier age.  If you have a family history of colorectal cancer, talk with your health care provider about genetic screening.  Your health care provider may also recommend using home test kits to check for hidden blood in your stool.  A small camera at the end of a tube can be used to examine your colon directly (sigmoidoscopy or colonoscopy). This is done to check for the earliest forms of colorectal cancer.  Direct examination of the colon  should be repeated every 5-10 years until age 75. However, if early forms of precancerous polyps or small growths are found or if you have a family history or genetic risk for colorectal cancer, you may need to be screened more often.  Skin Cancer  Check your skin from head to toe regularly.  Monitor any moles. Be sure to tell your health care provider: ? About any new moles or changes in moles, especially if there is a change in a mole's shape or color. ? If you have a mole that is larger than the size of a pencil eraser.  If any of your family members has a history of skin cancer, especially at a Marcio Hoque age, talk with your health care provider about genetic screening.  Always use sunscreen. Apply sunscreen liberally and repeatedly throughout the day.  Whenever you are outside, protect yourself by wearing long sleeves, pants, a wide-brimmed hat, and sunglasses.  What should I know about osteoporosis? Osteoporosis is a condition in which bone destruction happens more quickly than new bone creation. After menopause, you may be at an increased risk for osteoporosis. To help prevent osteoporosis or the bone fractures that can happen because of osteoporosis, the following is recommended:  If you are 19-50 years old, get at least 1,000 mg of calcium and at least 600 mg of vitamin D per day.  If you are older than age 50 but younger than age 70, get at least 1,200 mg of calcium and at least 600 mg of vitamin D per day.  If you are older than age 70, get at least 1,200 mg of calcium and at least 800 mg of vitamin D per day.  Smoking and excessive alcohol intake increase the risk of osteoporosis. Eat foods that are rich in calcium and vitamin D, and do weight-bearing exercises several times each week as directed by your health care provider. What should I know about how menopause affects my mental health? Depression may occur at any age, but it is more common as you become older. Common symptoms of  depression include:  Low or sad mood.  Changes in sleep patterns.  Changes in appetite or eating patterns.  Feeling an overall lack of motivation or enjoyment of activities that you previously enjoyed.  Frequent crying spells.  Talk with your health care provider if you think that you are experiencing depression. What should I know about immunizations? It is important that you get and maintain your immunizations. These include:  Tetanus, diphtheria, and pertussis (Tdap) booster vaccine.  Influenza every year before the flu season begins.  Pneumonia vaccine.  Shingles vaccine.  Your health care provider may also recommend other immunizations. This information is not intended to replace advice given to you by your health care provider. Make sure you discuss any questions you have with your health care   provider. Document Released: 08/13/2005 Document Revised: 01/09/2016 Document Reviewed: 03/25/2015 Elsevier Interactive Patient Education  2018 [employer]. Menopause and Hormone Replacement Therapy What is hormone replacement therapy? Hormone replacement therapy (HRT) is the use of artificial (synthetic) hormones to replace hormones that your body stops producing during menopause. Menopause is the normal time of life when menstrual periods stop completely and the ovaries stop producing the female hormones estrogen and progesterone. This lack of hormones can affect your health and cause undesirable symptoms. HRT can relieve some of those symptoms. What are my options for HRT? HRT may consist of the synthetic hormones estrogen and progestin, or it may consist of only estrogen (estrogen-only therapy). You and your health care provider will decide which form of HRT is best for you. If you choose to be on HRT and you have a uterus, estrogen and progestin are usually prescribed. Estrogen-only therapy is used for women who do not have a uterus. Possible options for taking HRT  include:  Pills.  Patches.  Gels.  Sprays.  Vaginal cream.  Vaginal rings.  Vaginal inserts.  The amount of hormone(s) that you take and how long you take the hormone(s) varies depending on your individual health. It is important to:  Begin HRT with the lowest possible dosage.  Stop HRT as soon as your health care provider tells you to stop.  Work with your health care provider so that you feel informed and comfortable with your decisions.  What are the benefits of HRT? HRT can reduce the frequency and severity of menopausal symptoms. Benefits of HRT vary depending on the menopausal symptoms that you have, the severity of your symptoms, and your overall health. HRT may help to improve the following menopausal symptoms:  Hot flashes and night sweats. These are sudden feelings of heat that spread over the face and body. The skin may turn red, like a blush. Night sweats are hot flashes that happen while you are sleeping or trying to sleep.  Bone loss (osteoporosis). The body loses calcium more quickly after menopause, causing the bones to become weaker. This can increase the risk for bone breaks (fractures).  Vaginal dryness. The lining of the vagina can become thin and dry, which can cause pain during sexual intercourse or cause infection, burning, or itching.  Urinary tract infections.  Urinary incontinence. This is a decreased ability to control when you urinate.  Irritability.  Short-term memory problems.  What are the risks of HRT? Risks of HRT vary depending on your individual health and medical history. Risks of HRT also depend on whether you receive both estrogen and progestin or you receive estrogen only.HRT may increase the risk of:  Spotting. This is when a small amount of bloodleaks from the vagina unexpectedly.  Endometrial cancer. This cancer is in the lining of the uterus (endometrium).  Breast cancer.  Increased density of breast tissue. This can make  it harder to find breast cancer on a breast X-ray (mammogram).  Stroke.  Heart attack.  Blood clots.  Gallbladder disease.  Risks of HRT can increase if you have any of the following conditions:  Endometrial cancer.  Liver disease.  Heart disease.  Breast cancer.  History of blood clots.  History of stroke.  How should I care for myself while I am on HRT?  Take over-the-counter and prescription medicines only as told by your health care provider.  Get mammograms, pelvic exams, and medical checkups as often as told by your health care provider.  Have Pap  tests done as often as told by your health care provider. A Pap test is sometimes called a Pap smear. It is a screening test that is used to check for signs of cancer of the cervix and vagina. A Pap test can also identify the presence of infection or precancerous changes. Pap tests may be done: ? Every 3 years, starting at age 61. ? Every 5 years, starting after age 48, in combination with testing for human papillomavirus (HPV). ? More often or less often depending on other medical conditions you have, your age, and other risk factors.  It is your responsibility to get your Pap test results. Ask your health care provider or the department performing the test when your results will be ready.  Keep all follow-up visits as told by your health care provider. This is important. When should I seek medical care? Talk with your health care provider if:  You have any of these: ? Pain or swelling in your legs. ? Shortness of breath. ? Chest pain. ? Lumps or changes in your breasts or armpits. ? Slurred speech. ? Pain, burning, or bleeding when you urine.  You develop any of these: ? Unusual vaginal bleeding. ? Dizziness or headaches. ? Weakness or numbness in any part of your arms or legs. ? Pain in your abdomen.  This information is not intended to replace advice given to you by your health care provider. Make sure you  discuss any questions you have with your health care provider. Document Released: 03/20/2003 Document Revised: 05/18/2016 Document Reviewed: 12/23/2014 Elsevier Interactive Patient Education  2017 [employer].
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
## 2017-08-31 ENCOUNTER — Other Ambulatory Visit: Payer: Self-pay | Admitting: Women's Health

## 2017-08-31 DIAGNOSIS — Z1231 Encounter for screening mammogram for malignant neoplasm of breast: Secondary | ICD-10-CM

## 2017-09-23 ENCOUNTER — Ambulatory Visit
Admission: RE | Admit: 2017-09-23 | Discharge: 2017-09-23 | Disposition: A | Discharge status: Home / Self Care | Payer: BLUE CROSS/BLUE SHIELD | Source: Ambulatory Visit | Attending: Women's Health | Admitting: Women's Health

## 2017-09-23 DIAGNOSIS — Z1231 Encounter for screening mammogram for malignant neoplasm of breast: Secondary | ICD-10-CM

## 2018-04-14 ENCOUNTER — Other Ambulatory Visit: Payer: Self-pay | Admitting: Women's Health

## 2018-04-14 DIAGNOSIS — Z1231 Encounter for screening mammogram for malignant neoplasm of breast: Secondary | ICD-10-CM

## 2018-08-17 ENCOUNTER — Other Ambulatory Visit: Payer: Self-pay | Admitting: Women's Health

## 2018-08-17 DIAGNOSIS — Z1231 Encounter for screening mammogram for malignant neoplasm of breast: Secondary | ICD-10-CM

## 2018-09-27 ENCOUNTER — Ambulatory Visit: Payer: BLUE CROSS/BLUE SHIELD

## 2019-02-05 ENCOUNTER — Ambulatory Visit
Admission: RE | Admit: 2019-02-05 | Discharge: 2019-02-05 | Disposition: A | Discharge status: Home / Self Care | Payer: BC Managed Care – PPO | Source: Ambulatory Visit | Attending: Women's Health | Admitting: Women's Health

## 2019-02-05 ENCOUNTER — Other Ambulatory Visit: Payer: Self-pay

## 2019-02-05 DIAGNOSIS — Z1231 Encounter for screening mammogram for malignant neoplasm of breast: Secondary | ICD-10-CM

## 2019-02-09 ENCOUNTER — Other Ambulatory Visit: Payer: Self-pay

## 2019-02-12 ENCOUNTER — Ambulatory Visit (INDEPENDENT_AMBULATORY_CARE_PROVIDER_SITE_OTHER): Payer: BC Managed Care – PPO | Admitting: Women's Health

## 2019-02-12 ENCOUNTER — Other Ambulatory Visit: Payer: Self-pay

## 2019-02-12 ENCOUNTER — Encounter: Payer: Self-pay | Admitting: Women's Health

## 2019-02-12 VITALS — BP 110/70 | Ht 63.75 in | Wt 150.0 lb

## 2019-02-12 DIAGNOSIS — Z1151 Encounter for screening for human papillomavirus (HPV): Secondary | ICD-10-CM | POA: Diagnosis not present

## 2019-02-12 DIAGNOSIS — Z01419 Encounter for gynecological examination (general) (routine) without abnormal findings: Secondary | ICD-10-CM | POA: Diagnosis not present

## 2019-02-12 MED ORDER — FLUOXETINE HCL 10 MG PO CAPS: 10.0000 mg | ORAL_CAPSULE | Freq: Every day | ORAL | 3 refills | Status: DC

## 2019-02-12 MED ORDER — PREMARIN 0.625 MG/GM VA CREA: TOPICAL_CREAM | VAGINAL | 4 refills | Status: DC
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
## 2019-02-12 NOTE — Patient Instructions (Addendum)
Janet Carney whitt  [phone]  counselor  Maintenance for Postmenopausal Women Menopause is a normal process in which your ability to get pregnant comes to an end. This process happens slowly over many months or years, usually between the ages of 1 and 14. Menopause is complete when you have missed your menstrual periods for 12 months. It is important to talk with your health care provider about some of the most common conditions that affect women after menopause (postmenopausal women). These include heart disease, cancer, and bone loss (osteoporosis). Adopting a healthy lifestyle and getting preventive care can help to promote your health and wellness. The actions you take can also lower your chances of developing some of these common conditions. What should I know about menopause? During menopause, you may get a number of symptoms, such as:  Hot flashes. These can be moderate or severe.  Night sweats.  Decrease in sex drive.  Mood swings.  Headaches.  Tiredness.  Irritability.  Memory problems.  Insomnia. Choosing to treat or not to treat these symptoms is a decision that you make with your health care provider. Do I need hormone replacement therapy?  Hormone replacement therapy is effective in treating symptoms that are caused by menopause, such as hot flashes and night sweats.  Hormone replacement carries certain risks, especially as you become older. If you are thinking about using estrogen or estrogen with progestin, discuss the benefits and risks with your health care provider. What is my risk for heart disease and stroke? The risk of heart disease, heart attack, and stroke increases as you age. One of the causes may be a change in the body's hormones during menopause. This can affect how your body uses dietary fats, triglycerides, and cholesterol. Heart attack and stroke are medical emergencies. There are many things that you can do to help prevent heart disease and stroke. Watch  your blood pressure  High blood pressure causes heart disease and increases the risk of stroke. This is more likely to develop in people who have high blood pressure readings, are of African descent, or are overweight.  Have your blood pressure checked: ? Every 3-5 years if you are 37-73 years of age. ? Every year if you are 62 years old or older. Eat a healthy diet   Eat a diet that includes plenty of vegetables, fruits, low-fat dairy products, and lean protein.  Do not eat a lot of foods that are high in solid fats, added sugars, or sodium. Get regular exercise Get regular exercise. This is one of the most important things you can do for your health. Most adults should:  Try to exercise for at least 150 minutes each week. The exercise should increase your heart rate and make you sweat (moderate-intensity exercise).  Try to do strengthening exercises at least twice each week. Do these in addition to the moderate-intensity exercise.  Spend less time sitting. Even light physical activity can be beneficial. Other tips  Work with your health care provider to achieve or maintain a healthy weight.  Do not use any products that contain nicotine or tobacco, such as cigarettes, e-cigarettes, and chewing tobacco. If you need help quitting, ask your health care provider.  Know your numbers. Ask your health care provider to check your cholesterol and your blood sugar (glucose). Continue to have your blood tested as directed by your health care provider. Do I need screening for cancer? Depending on your health history and family history, you may need to have cancer screening at  different stages of your life. This may include screening for:  Breast cancer.  Cervical cancer.  Lung cancer.  Colorectal cancer. What is my risk for osteoporosis? After menopause, you may be at increased risk for osteoporosis. Osteoporosis is a condition in which bone destruction happens more quickly than new bone  creation. To help prevent osteoporosis or the bone fractures that can happen because of osteoporosis, you may take the following actions:  If you are 50-18 years old, get at least 1,000 mg of calcium and at least 600 mg of vitamin D per day.  If you are older than age 42 but younger than age 23, get at least 1,200 mg of calcium and at least 600 mg of vitamin D per day.  If you are older than age 28, get at least 1,200 mg of calcium and at least 800 mg of vitamin D per day. Smoking and drinking excessive alcohol increase the risk of osteoporosis. Eat foods that are rich in calcium and vitamin D, and do weight-bearing exercises several times each week as directed by your health care provider. How does menopause affect my mental health? Depression may occur at any age, but it is more common as you become older. Common symptoms of depression include:  Low or sad mood.  Changes in sleep patterns.  Changes in appetite or eating patterns.  Feeling an overall lack of motivation or enjoyment of activities that you previously enjoyed.  Frequent crying spells. Talk with your health care provider if you think that you are experiencing depression. General instructions See your health care provider for regular wellness exams and vaccines. This may include:  Scheduling regular health, dental, and eye exams.  Getting and maintaining your vaccines. These include: ? Influenza vaccine. Get this vaccine each year before the flu season begins. ? Pneumonia vaccine. ? Shingles vaccine. ? Tetanus, diphtheria, and pertussis (Tdap) booster vaccine. Your health care provider may also recommend other immunizations. Tell your health care provider if you have ever been abused or do not feel safe at home. Summary  Menopause is a normal process in which your ability to get pregnant comes to an end.  This condition causes hot flashes, night sweats, decreased interest in sex, mood swings, headaches, or lack of  sleep.  Treatment for this condition may include hormone replacement therapy.  Take actions to keep yourself healthy, including exercising regularly, eating a healthy diet, watching your weight, and checking your blood pressure and blood sugar levels.  Get screened for cancer and depression. Make sure that you are up to date with all your vaccines. This information is not intended to replace advice given to you by your health care provider. Make sure you discuss any questions you have with your health care provider. Document Released: 08/13/2005 Document Revised: 06/14/2018 Document Reviewed: 06/14/2018 Elsevier Patient Education  2020 [employer].

## 2019-02-13 NOTE — Addendum Note (Signed)
Addended by: Thurnell Garbe A on: 02/13/2019 11:31 AM   Modules accepted: Orders

## 2019-02-14 LAB — PAP, TP IMAGING W/ HPV RNA, RFLX HPV TYPE 16,18/45: HPV DNA High Risk: NOT DETECTED

## 2019-05-09 ENCOUNTER — Ambulatory Visit: Payer: Self-pay

## 2019-05-09 ENCOUNTER — Other Ambulatory Visit: Payer: Self-pay | Admitting: Radiology

## 2019-05-09 ENCOUNTER — Other Ambulatory Visit: Payer: Self-pay

## 2019-05-09 ENCOUNTER — Ambulatory Visit: Payer: BC Managed Care – PPO | Admitting: Orthopaedic Surgery

## 2019-05-09 ENCOUNTER — Encounter: Payer: Self-pay | Admitting: Orthopaedic Surgery

## 2019-05-09 DIAGNOSIS — M545 Low back pain, unspecified: Secondary | ICD-10-CM

## 2019-05-09 DIAGNOSIS — M25562 Pain in left knee: Secondary | ICD-10-CM

## 2019-05-09 DIAGNOSIS — M25551 Pain in right hip: Secondary | ICD-10-CM

## 2019-05-09 DIAGNOSIS — M7050 Other bursitis of knee, unspecified knee: Secondary | ICD-10-CM | POA: Diagnosis not present

## 2019-05-09 DIAGNOSIS — M25561 Pain in right knee: Secondary | ICD-10-CM

## 2019-05-09 DIAGNOSIS — G8929 Other chronic pain: Secondary | ICD-10-CM

## 2019-05-09 NOTE — Progress Notes (Signed)
Office Visit Note   Patient: Janet Carney           Date of Birth: 1970/12/23           MRN: [id number] Visit Date: 05/09/2019              Requested by: No referring provider defined for this encounter. PCP: Patient, No Pcp Per   Assessment & Plan: Visit Diagnoses:  1. Pes anserine bursitis   2. Left knee pain, unspecified chronicity   3. Pain in right hip   4. Right low back pain, unspecified chronicity, unspecified whether sciatica present   5. Chronic pain of right knee     Plan: We will send her to formal therapy for back strengthening exercises, hamstring gastrocsoleus stretching.  Strengthening both knees.  Modalities and home exercise program.  Like to see her back in a month to see what type of response she had to this.  She can continue to use Voltaren gel on knees over the pes anserinus area.  Questions encouraged and answered by Dr. Magnus Ivan myself.  Follow-Up Instructions: Return in about 4 weeks (around 06/06/2019).   Orders:  Orders Placed This Encounter  Procedures   XR Knee 1-2 Views Right   XR Knee 1-2 Views Left   XR Pelvis 1-2 Views   XR Lumbar Spine 2-3 Views   No orders of the defined types were placed in this encounter.     Procedures: No procedures performed   Clinical Data: No additional findings.   Subjective: Chief Complaint  Patient presents with   Right Hip - Pain  Bilateral knee pain left greater than  HPI  Janet Carney is a pleasant 48 year old female comes in today with low back right hip pain and bilateral knee pain left greater than right.  She had no injury to her hip or knees.  She denies any radicular symptoms down either leg.  Denies any saddle anesthesia like symptoms.  Denies any bowel bladder dysfunction.  Denies any waking pain.  Points to her right lower back SI joint region as the source of her right hip pain.  Has occasional popping within the hip but this is nonpainful.  She denies any mechanical symptoms of either  knee.  She does note some swelling of the left knee particularly at the end of the day.  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  She is tried Advil and Voltaren gel without any real relief.  She denies any other joint pain outside of her right hand she has occasional triggering of her ring finger.  States this is only occurred on 2 occasions.   Review of Systems Shortness of breath and chest pain.  Please see HPI otherwise negative or noncontributory.  Objective: Vital Signs: There were no vitals taken for this visit.  Physical Exam Constitutional:      Appearance: She is normal weight. She is not diaphoretic.  Cardiovascular:     Pulses: Normal pulses.  Pulmonary:     Effort: Pulmonary effort is normal.  Neurological:     Mental Status: She is alert and oriented to person, place, and time.  Psychiatric:        Mood and Affect: Mood normal.     Ortho Exam Lumbar spine full extension flexion without pain.  Negative straight leg raise bilaterally.   5 out of 5 strength throughout the lower extremities against resistance.  Deep tendon reflexes are 2+ at the knees and ankles  and equal and symmetric.  Bilateral hips full range of motion without pain.  Tenderness over the left trochanteric region only.  Tenderness over the right SI joint region.  Bilateral knees full range of motion without pain.  Tenderness along medial joint line of both knees tenderness along the lateral joint line of the left knee.  No instability valgus varus stressing bilateral knees.  Tenderness over the pes anserinus bilaterally.  No effusion abnormal warmth erythema of either knee.  Anterior drawer is negative bilaterally.  Specialty Comments:  No specialty comments available.  Imaging: Xr Knee 1-2 Views Left  Result Date: 05/09/2019 Left knee AP lateral views: No acute fractures.  Knee is well-preserved.  No arthritic changes.  No bony abnormalities  Xr Knee 1-2 Views Right  Result Date:  05/09/2019 Right knee AP lateral views: No acute fractures.  Knee is well-preserved.  No arthritic changes noted.  Xr Lumbar Spine 2-3 Views  Result Date: 05/09/2019 Lumbar spine: AP and lateral views for space well-maintained.  No spondylolisthesis.  No acute fractures no arthritic changes noted.  Xr Pelvis 1-2 Views  Result Date: 05/09/2019 AP pelvis: Bilateral hips well located.  No significant arthritic changes of either hip.  No acute fractures.  No acute findings.    PMFS History: Patient Active Problem List   Diagnosis Date Noted   Situational stress 06/07/2013   Migraine syndrome 01/60/1093   HELICOBACTER PYLORI INFECTION 01/02/2007   Past Medical History:  Diagnosis Date   Migraines     Family History  Problem Relation Age of Onset   Lung cancer Father    Diabetes Father     Past Surgical History:  Procedure Laterality Date   BREAST BIOPSY  2002   Social History   Occupational History   Not on file  Tobacco Use   Smoking status: Never Smoker   Smokeless tobacco: Never Used  Substance and Sexual Activity   Alcohol use: No   Drug use: No   Sexual activity: Yes

## 2019-07-03 ENCOUNTER — Other Ambulatory Visit: Payer: Self-pay | Admitting: Women's Health

## 2019-07-04 NOTE — Telephone Encounter (Signed)
Okay for refill #90 with refills until next annual

## 2020-02-13 ENCOUNTER — Encounter: Payer: BC Managed Care – PPO | Admitting: Nurse Practitioner

## 2020-02-13 ENCOUNTER — Ambulatory Visit (INDEPENDENT_AMBULATORY_CARE_PROVIDER_SITE_OTHER): Payer: BC Managed Care – PPO | Admitting: Nurse Practitioner

## 2020-02-13 ENCOUNTER — Other Ambulatory Visit: Payer: Self-pay

## 2020-02-13 ENCOUNTER — Encounter: Payer: Self-pay | Admitting: Nurse Practitioner

## 2020-02-13 VITALS — BP 120/82 | Ht 62.75 in | Wt 149.0 lb

## 2020-02-13 DIAGNOSIS — Z01419 Encounter for gynecological examination (general) (routine) without abnormal findings: Secondary | ICD-10-CM | POA: Diagnosis not present

## 2020-02-13 DIAGNOSIS — N951 Menopausal and female climacteric states: Secondary | ICD-10-CM | POA: Diagnosis not present

## 2020-02-13 DIAGNOSIS — F329 Major depressive disorder, single episode, unspecified: Secondary | ICD-10-CM

## 2020-02-13 DIAGNOSIS — E559 Vitamin D deficiency, unspecified: Secondary | ICD-10-CM | POA: Diagnosis not present

## 2020-02-13 DIAGNOSIS — F32A Depression, unspecified: Secondary | ICD-10-CM

## 2020-02-13 MED ORDER — FLUOXETINE HCL 20 MG PO CAPS: 20.0000 mg | ORAL_CAPSULE | Freq: Every day | ORAL | 11 refills | Status: DC

## 2020-02-13 NOTE — Progress Notes (Signed)
   Janet Carney 03-17-1971 [id number]   History:  49 y.o. [id number] presents for annual exam. She complains of vaginal dryness with intercourse. She was on premarin cream in the past but did not have much improvement. Postmenopausal since age 49, no HRT, no bleeding. Normal pap and mammogram history. She takes Prozac for depression/stress and is doing OK on current dose but feels she needs a higher dose. She also complains of hot flashes and night sweats. History of Vitamin D deficiency.   Gynecologic History No LMP recorded. (Menstrual status: Perimenopausal).   Contraception: post menopausal status Last Pap: 02/13/2019. Results were: normal Last mammogram: 02/06/2019. Results were: normal Last colonoscopy: never   Past medical history, past surgical history, family history and social history were all reviewed and documented in the EPIC chart.  ROS:  A ROS was performed and pertinent positives and negatives are included.  Exam:  Vitals:   02/13/20 1518  BP: 120/82  Weight: 149 lb (67.6 kg)  Height: 5' 2.75" (1.594 m)   Body mass index is 26.6 kg/m.  General appearance:  Normal Thyroid:  Symmetrical, normal in size, without palpable masses or nodularity. Respiratory  Auscultation:  Clear without wheezing or rhonchi Cardiovascular  Auscultation:  Regular rate, without rubs, murmurs or gallops  Edema/varicosities:  Not grossly evident Abdominal  Soft,nontender, without masses, guarding or rebound.  Liver/spleen:  No organomegaly noted  Hernia:  None appreciated  Skin  Inspection:  Grossly normal   Breasts: Examined lying and sitting.   Right: Without masses, retractions, discharge or axillary adenopathy.   Left: Without masses, retractions, discharge or axillary adenopathy. Gentitourinary   Inguinal/mons:  Normal without inguinal adenopathy  External genitalia:  Normal  BUS/Urethra/Skene's glands:  Normal  Vagina:  Atrophy  Cervix:  Normal  Uterus:  Normal in size, shape  and contour.  Midline and mobile  Adnexa/parametria:     Rt: Without masses or tenderness.   Lt: Without masses or tenderness.  Anus and perineum: Normal  Digital rectal exam: Normal sphincter tone without palpated masses or tenderness  Assessment/Plan:  49 y.o. [id number] for annual exam.   Well female exam with routine gynecological exam - Plan: CBC with Differential/Platelet, Comprehensive metabolic panel. Education provided on SBEs, importance of preventative screenings, current guidelines, high calcium diet, regular exercise, and multivitamin daily.   Vasomotor symptoms due to menopause - Plan: FLUoxetine (PROZAC) 20 MG capsule. Increased Prozac to see if this will help with her hot flashes and night sweats. Discussed other options to include over the counter Black Cohosh, Ginseng, and Vitamin E and hormone replacement therapy. We discussed the risks and benefits of these.   Depression, unspecified depression type - Plan: FLUoxetine (PROZAC) 20 MG capsule. She feels she needs a higher dose for her depressive moods and work stress.   Vitamin D deficiency - Plan: VITAMIN D 25 Hydroxy (Vit-D Deficiency, Fractures)  Screening for colon cancer - has not had screening colonoscopy. Educated on current guidelines and importance of preventative screenings. Information provided on [facility].   Follow up in 1 year for annual     Janet Carney [facility], 3:26 PM 02/13/2020

## 2020-02-13 NOTE — Patient Instructions (Addendum)
Over the counter vaginal lubricants: Replens, Vagisil moisturizer, Feminease, Moist Again, K-Y Liquibeads, Hyalo GYN  Stool softener: Colace, Miralax  Schedule colonoscopy! [facility] [phone] [address] [place], [place] [address]   Health Maintenance, Female Adopting a healthy lifestyle and getting preventive care are important in promoting health and wellness. Ask your health care provider about:  The right schedule for you to have regular tests and exams.  Things you can do on your own to prevent diseases and keep yourself healthy. What should I know about diet, weight, and exercise? Eat a healthy diet   Eat a diet that includes plenty of vegetables, fruits, low-fat dairy products, and lean protein.  Do not eat a lot of foods that are high in solid fats, added sugars, or sodium. Maintain a healthy weight Body mass index (BMI) is used to identify weight problems. It estimates body fat based on height and weight. Your health care provider can help determine your BMI and help you achieve or maintain a healthy weight. Get regular exercise Get regular exercise. This is one of the most important things you can do for your health. Most adults should:  Exercise for at least 150 minutes each week. The exercise should increase your heart rate and make you sweat (moderate-intensity exercise).  Do strengthening exercises at least twice a week. This is in addition to the moderate-intensity exercise.  Spend less time sitting. Even light physical activity can be beneficial. Watch cholesterol and blood lipids Have your blood tested for lipids and cholesterol at 52 years of age, then have this test every 5 years. Have your cholesterol levels checked more often if:  Your lipid or cholesterol levels are high.  You are older than 51 years of age.  You are at high risk for heart disease. What should I know about cancer screening? Depending on your health history and family history,  you may need to have cancer screening at various ages. This may include screening for:  Breast cancer.  Cervical cancer.  Colorectal cancer.  Skin cancer.  Lung cancer. What should I know about heart disease, diabetes, and high blood pressure? Blood pressure and heart disease  High blood pressure causes heart disease and increases the risk of stroke. This is more likely to develop in people who have high blood pressure readings, are of African descent, or are overweight.  Have your blood pressure checked: ? Every 3-5 years if you are 35-48 years of age. ? Every year if you are 89 years old or older. Diabetes Have regular diabetes screenings. This checks your fasting blood sugar level. Have the screening done:  Once every three years after age 67 if you are at a normal weight and have a low risk for diabetes.  More often and at a younger age if you are overweight or have a high risk for diabetes. What should I know about preventing infection? Hepatitis B If you have a higher risk for hepatitis B, you should be screened for this virus. Talk with your health care provider to find out if you are at risk for hepatitis B infection. Hepatitis C Testing is recommended for:  Everyone born from [birth year] through 1967.  Anyone with known risk factors for hepatitis C. Sexually transmitted infections (STIs)  Get screened for STIs, including gonorrhea and chlamydia, if: ? You are sexually active and are younger than 51 years of age. ? You are older than 51 years of age and your health care provider tells you that  you are at risk for this type of infection. ? Your sexual activity has changed since you were last screened, and you are at increased risk for chlamydia or gonorrhea. Ask your health care provider if you are at risk.  Ask your health care provider about whether you are at high risk for HIV. Your health care provider may recommend a prescription medicine to help prevent HIV infection.  If you choose to take medicine to prevent HIV, you should first get tested for HIV. You should then be tested every 3 months for as long as you are taking the medicine. Pregnancy  If you are about to stop having your period (premenopausal) and you may become pregnant, seek counseling before you get pregnant.  Take 400 to 800 micrograms (mcg) of folic acid every day if you become pregnant.  Ask for birth control (contraception) if you want to prevent pregnancy. Osteoporosis and menopause Osteoporosis is a disease in which the bones lose minerals and strength with aging. This can result in bone fractures. If you are 77 years old or older, or if you are at risk for osteoporosis and fractures, ask your health care provider if you should:  Be screened for bone loss.  Take a calcium or vitamin D supplement to lower your risk of fractures.  Be given hormone replacement therapy (HRT) to treat symptoms of menopause. Follow these instructions at home: Lifestyle  Do not use any products that contain nicotine or tobacco, such as cigarettes, e-cigarettes, and chewing tobacco. If you need help quitting, ask your health care provider.  Do not use street drugs.  Do not share needles.  Ask your health care provider for help if you need support or information about quitting drugs. Alcohol use  Do not drink alcohol if: ? Your health care provider tells you not to drink. ? You are pregnant, may be pregnant, or are planning to become pregnant.  If you drink alcohol: ? Limit how much you use to 0-1 drink a day. ? Limit intake if you are breastfeeding.  Be aware of how much alcohol is in your drink. In the [place] one drink equals one 12 oz bottle of beer (355 mL), one 5 oz glass of wine (148 mL), or one 1 oz glass of hard liquor (44 mL). General instructions  Schedule regular health, dental, and eye exams.  Stay current with your vaccines.  Tell your health care provider if: ? You often feel  depressed. ? You have ever been abused or do not feel safe at home. Summary  Adopting a healthy lifestyle and getting preventive care are important in promoting health and wellness.  Follow your health care provider's instructions about healthy diet, exercising, and getting tested or screened for diseases.  Follow your health care provider's instructions on monitoring your cholesterol and blood pressure. This information is not intended to replace advice given to you by your health care provider. Make sure you discuss any questions you have with your health care provider. Document Revised: 06/14/2018 Document Reviewed: 06/14/2018 Elsevier Patient Education  2020 [employer].

## 2020-02-14 ENCOUNTER — Telehealth: Payer: Self-pay | Admitting: *Deleted

## 2020-02-14 DIAGNOSIS — E559 Vitamin D deficiency, unspecified: Secondary | ICD-10-CM

## 2020-02-14 LAB — COMPREHENSIVE METABOLIC PANEL
AG Ratio: 1.9 (calc) (ref 1.0–2.5)
ALT: 13 U/L (ref 6–29)
AST: 18 U/L (ref 10–35)
Albumin: 4.4 g/dL (ref 3.6–5.1)
Alkaline phosphatase (APISO): 61 U/L (ref 37–153)
BUN: 12 mg/dL (ref 7–25)
CO2: 28 mmol/L (ref 20–32)
Calcium: 9.6 mg/dL (ref 8.6–10.4)
Chloride: 101 mmol/L (ref 98–110)
Creat: 0.8 mg/dL (ref 0.50–1.05)
Globulin: 2.3 g/dL (calc) (ref 1.9–3.7)
Glucose, Bld: 89 mg/dL (ref 65–99)
Potassium: 4 mmol/L (ref 3.5–5.3)
Sodium: 136 mmol/L (ref 135–146)
Total Bilirubin: 0.4 mg/dL (ref 0.2–1.2)
Total Protein: 6.7 g/dL (ref 6.1–8.1)

## 2020-02-14 LAB — CBC WITH DIFFERENTIAL/PLATELET
Absolute Monocytes: 680 cells/uL (ref 200–950)
Basophils Absolute: 92 cells/uL (ref 0–200)
Basophils Relative: 1.1 %
Eosinophils Absolute: 244 cells/uL (ref 15–500)
Eosinophils Relative: 2.9 %
HCT: 35.2 % (ref 35.0–45.0)
Hemoglobin: 11.8 g/dL (ref 11.7–15.5)
Lymphs Abs: 3587 cells/uL (ref 850–3900)
MCH: 29.3 pg (ref 27.0–33.0)
MCHC: 33.5 g/dL (ref 32.0–36.0)
MCV: 87.3 fL (ref 80.0–100.0)
MPV: 9 fL (ref 7.5–12.5)
Monocytes Relative: 8.1 %
Neutro Abs: 3797 cells/uL (ref 1500–7800)
Neutrophils Relative %: 45.2 %
Platelets: 314 10*3/uL (ref 140–400)
RBC: 4.03 10*6/uL (ref 3.80–5.10)
RDW: 11.8 % (ref 11.0–15.0)
Total Lymphocyte: 42.7 %
WBC: 8.4 10*3/uL (ref 3.8–10.8)

## 2020-02-14 LAB — VITAMIN D 25 HYDROXY (VIT D DEFICIENCY, FRACTURES): Vit D, 25-Hydroxy: 23 ng/mL — ABNORMAL LOW (ref 30–100)

## 2020-02-14 MED ORDER — VITAMIN D (ERGOCALCIFEROL) 1.25 MG (50000 UNIT) PO CAPS: 50000.0000 [IU] | ORAL_CAPSULE | ORAL | 0 refills | Status: DC

## 2020-02-14 NOTE — Telephone Encounter (Signed)
Prescription vit d sent into pharmacy. Pt aware

## 2020-04-14 ENCOUNTER — Other Ambulatory Visit: Payer: BC Managed Care – PPO

## 2020-04-15 ENCOUNTER — Other Ambulatory Visit: Payer: Self-pay | Admitting: Nurse Practitioner

## 2020-04-16 ENCOUNTER — Other Ambulatory Visit: Payer: BC Managed Care – PPO

## 2020-04-16 ENCOUNTER — Other Ambulatory Visit: Payer: Self-pay

## 2020-04-16 DIAGNOSIS — E559 Vitamin D deficiency, unspecified: Secondary | ICD-10-CM

## 2020-04-17 LAB — VITAMIN D 25 HYDROXY (VIT D DEFICIENCY, FRACTURES): Vit D, 25-Hydroxy: 45 ng/mL (ref 30–100)

## 2020-08-04 ENCOUNTER — Other Ambulatory Visit: Payer: Self-pay | Admitting: Nurse Practitioner

## 2020-08-04 DIAGNOSIS — Z1231 Encounter for screening mammogram for malignant neoplasm of breast: Secondary | ICD-10-CM

## 2020-09-01 ENCOUNTER — Ambulatory Visit: Payer: BC Managed Care – PPO | Admitting: Nurse Practitioner

## 2020-09-02 ENCOUNTER — Other Ambulatory Visit: Payer: Self-pay

## 2020-09-02 ENCOUNTER — Encounter: Payer: Self-pay | Admitting: Nurse Practitioner

## 2020-09-02 ENCOUNTER — Ambulatory Visit: Payer: BC Managed Care – PPO | Admitting: Nurse Practitioner

## 2020-09-02 VITALS — BP 110/66 | HR 76 | Resp 12 | Ht 63.0 in | Wt 148.5 lb

## 2020-09-02 DIAGNOSIS — N951 Menopausal and female climacteric states: Secondary | ICD-10-CM | POA: Diagnosis not present

## 2020-09-02 DIAGNOSIS — E559 Vitamin D deficiency, unspecified: Secondary | ICD-10-CM

## 2020-09-02 DIAGNOSIS — R300 Dysuria: Secondary | ICD-10-CM | POA: Diagnosis not present

## 2020-09-02 LAB — URINALYSIS, COMPLETE W/RFL CULTURE
Bilirubin Urine: NEGATIVE
Nitrites, Initial: NEGATIVE
Protein, ur: NEGATIVE

## 2020-09-02 LAB — WET PREP FOR TRICH, YEAST, CLUE

## 2020-09-02 NOTE — Progress Notes (Signed)
   Acute Office Visit  Subjective:    Patient ID: Janet Carney, female    DOB: 07/12/1970, 50 y.o.   MRN: [id number]   HPI 50 y.o. presents today for burning with urination that started 5 days ago. The burning occurs at the start of urination, during, and after. She took AZO yesterday with some relief and has felt better with hydration. Today her symptoms are minimal. Denies vaginal itching, discharge, or odor. She would like her Vitamin D rechecked. It was low 6 months ago and she feels tired and thinks it may be related. She is taking 500 IU daily.    Review of Systems  Constitutional: Positive for fatigue. Negative for chills and fever.  Genitourinary: Positive for dysuria and vaginal pain. Negative for frequency, hematuria, urgency and vaginal discharge.       Objective:    Physical Exam Constitutional:      Appearance: Normal appearance.  Genitourinary:    General: Normal vulva.     Vagina: Normal.     BP 110/66 (BP Location: Right Arm, Patient Position: Sitting, Cuff Size: Normal)   Pulse 76   Resp 12   Ht 5\' 3"  (1.6 m)   Wt 148 lb 8 oz (67.4 kg)   BMI 26.31 kg/m  Wt Readings from Last 3 Encounters:  09/02/20 148 lb 8 oz (67.4 kg)  02/13/20 149 lb (67.6 kg)  02/12/19 150 lb (68 kg)   UA negative Wet prep negative     Assessment & Plan:   Problem List Items Addressed This Visit   None   Visit Diagnoses    Burning with urination    -  Primary   Relevant Orders   Urinalysis,Complete w/RFL Culture   WET PREP FOR TRICH, YEAST, CLUE   Vitamin D deficiency       Relevant Orders   VITAMIN D 25 Hydroxy (Vit-D Deficiency, Fractures)     Plan: Reassurance provided on normal UA, wet prep, and exam. We discussed vaginal dryness and management options. She will continue Vitamin E vaginal oil. She uses sparingly and I recommend she use daily for maintenance lubrication. We will recheck Vitamin D today per request. She is agreeable to plan.      04/14/19  [facility], 4:50 PM 09/02/2020

## 2020-09-03 LAB — VITAMIN D 25 HYDROXY (VIT D DEFICIENCY, FRACTURES): Vit D, 25-Hydroxy: 53 ng/mL (ref 30–100)

## 2020-09-04 LAB — URINALYSIS, COMPLETE W/RFL CULTURE
Glucose, UA: NEGATIVE
Hyaline Cast: NONE SEEN /LPF
Ketones, ur: NEGATIVE
Specific Gravity, Urine: 1.015 (ref 1.001–1.03)
pH: 6 (ref 5.0–8.0)

## 2020-09-04 LAB — URINE CULTURE
MICRO NUMBER:: 11592398
Result:: NO GROWTH
SPECIMEN QUALITY:: ADEQUATE

## 2020-09-04 LAB — CULTURE INDICATED

## 2020-09-17 ENCOUNTER — Inpatient Hospital Stay: Admission: RE | Admit: 2020-09-17 | Payer: BC Managed Care – PPO | Source: Ambulatory Visit

## 2020-10-27 ENCOUNTER — Ambulatory Visit: Payer: BC Managed Care – PPO | Admitting: Podiatry

## 2020-10-27 ENCOUNTER — Other Ambulatory Visit: Payer: Self-pay

## 2020-10-27 ENCOUNTER — Ambulatory Visit (INDEPENDENT_AMBULATORY_CARE_PROVIDER_SITE_OTHER): Payer: BC Managed Care – PPO

## 2020-10-27 DIAGNOSIS — B351 Tinea unguium: Secondary | ICD-10-CM

## 2020-10-27 DIAGNOSIS — M722 Plantar fascial fibromatosis: Secondary | ICD-10-CM | POA: Diagnosis not present

## 2020-10-27 DIAGNOSIS — M79675 Pain in left toe(s): Secondary | ICD-10-CM

## 2020-10-27 MED ORDER — CLOTRIMAZOLE-BETAMETHASONE 1-0.05 % EX CREA: 1.0000 "application " | TOPICAL_CREAM | Freq: Two times a day (BID) | CUTANEOUS | 1 refills | Status: DC

## 2020-10-27 MED ORDER — MELOXICAM 15 MG PO TABS: 15.0000 mg | ORAL_TABLET | Freq: Every day | ORAL | 1 refills | Status: DC

## 2020-10-27 MED ORDER — TERBINAFINE HCL 250 MG PO TABS: 250.0000 mg | ORAL_TABLET | Freq: Every day | ORAL | 0 refills | Status: DC

## 2020-10-27 NOTE — Progress Notes (Signed)
   Subjective: 50 y.o. female presenting as a new patient for evaluation of heel pain bilateral.  Patient has a history of bilateral heel pain for several years now.  She was previously diagnosed by Dr. Elijah Birk, local podiatrist, for plantar fasciitis.  She received an injection in the past which did help significantly for a few months however she is looking for other alternatives besides the injections. Patient also states that she has a discolored toenail to the left hallux with thickening concerning for toenail fungus.  She also explains that she has intermittent itching to the bilateral feet especially during the summer when her feet sweat a lot.  Patient works at [employer] on her feet all day   Past Medical History:  Diagnosis Date  . Migraines      Objective: Physical Exam General: The patient is alert and oriented x3 in no acute distress.  Dermatology: Skin is warm, dry and supple bilateral lower extremities. Negative for open lesions or macerations bilateral.  Hyperkeratotic dystrophic nail noted to the left hallux nail plate  Vascular: Dorsalis Pedis and Posterior Tibial pulses palpable bilateral.  Capillary fill time is immediate to all digits.  Neurological: Epicritic and protective threshold intact bilateral.   Musculoskeletal: Tenderness to palpation to the plantar aspect of the bilateral heels along the plantar fascia. All other joints range of motion within normal limits bilateral. Strength 5/5 in all groups bilateral.   Radiographic exam: Normal osseous mineralization. Joint spaces preserved. No fracture/dislocation/boney destruction. No other soft tissue abnormalities or radiopaque foreign bodies.   Assessment: 1. plantar fasciitis bilateral feet 2.  Tinea pedis bilateral 3.  Onychomycosis of toenail left  Plan of Care:  1. Patient evaluated. Xrays reviewed.   2.  Patient has had injections before but she is looking for other alternatives other than steroid injections 3.   Continue Brooks running shoes and OTC arch supports 4.  Prescription for meloxicam 15 mg daily 5.  Today we discussed different treatment options for toenail fungus including oral, topical, and antifungal laser treatment modalities.  Patient opts for oral antifungal medication.  She denies a history of liver pathology or symptoms.  Prescription for Lamisil 250 mg #90 daily 6.  Prescription for Lotrisone cream apply 2 times daily 7.  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  Felecia Shelling, DPM [facility]  Dr. Felecia Shelling, DPM    [address] [place], [place] [address]                Office [phone]  Fax [phone]

## 2021-01-09 ENCOUNTER — Other Ambulatory Visit: Payer: Self-pay

## 2021-01-09 ENCOUNTER — Ambulatory Visit
Admission: RE | Admit: 2021-01-09 | Discharge: 2021-01-09 | Disposition: A | Discharge status: Home / Self Care | Payer: BC Managed Care – PPO | Source: Ambulatory Visit | Attending: Nurse Practitioner | Admitting: Nurse Practitioner

## 2021-01-09 DIAGNOSIS — Z1231 Encounter for screening mammogram for malignant neoplasm of breast: Secondary | ICD-10-CM

## 2021-02-16 ENCOUNTER — Encounter: Payer: Self-pay | Admitting: Nurse Practitioner

## 2021-02-16 ENCOUNTER — Ambulatory Visit (INDEPENDENT_AMBULATORY_CARE_PROVIDER_SITE_OTHER): Payer: BC Managed Care – PPO | Admitting: Nurse Practitioner

## 2021-02-16 ENCOUNTER — Other Ambulatory Visit: Payer: Self-pay

## 2021-02-16 VITALS — BP 122/82 | Ht 62.0 in | Wt 150.0 lb

## 2021-02-16 DIAGNOSIS — Z01419 Encounter for gynecological examination (general) (routine) without abnormal findings: Secondary | ICD-10-CM | POA: Diagnosis not present

## 2021-02-16 DIAGNOSIS — N951 Menopausal and female climacteric states: Secondary | ICD-10-CM | POA: Diagnosis not present

## 2021-02-16 DIAGNOSIS — E785 Hyperlipidemia, unspecified: Secondary | ICD-10-CM | POA: Diagnosis not present

## 2021-02-16 DIAGNOSIS — Z780 Asymptomatic menopausal state: Secondary | ICD-10-CM

## 2021-02-16 DIAGNOSIS — F32A Depression, unspecified: Secondary | ICD-10-CM

## 2021-02-16 MED ORDER — FLUOXETINE HCL 20 MG PO CAPS: 20.0000 mg | ORAL_CAPSULE | Freq: Every day | ORAL | 4 refills | Status: DC

## 2021-02-16 NOTE — Patient Instructions (Signed)
Schedule colonoscopy! [facility] [phone] [address] [place], [place] [address]  

## 2021-02-16 NOTE — Progress Notes (Signed)
   Janet Carney [birth year] [id number]   History:  50 y.o. [id number] presents for annual exam. Postmenopausal - no HRT, no bleeding. Normal pap and mammogram history. Doing well on Prozac for anxiety, depression, and vasomotor symptoms. History of Vitamin D deficiency.   Gynecologic History No LMP recorded. (Menstrual status: Postmenopausal).   Contraception: post menopausal status  Health Maintenance Last Pap: 02/13/2019. Results were: normal, 5-year repeat Last mammogram: 01/09/2021 Results were: normal Last colonoscopy: Never Last Dexa: Not indicated  Past medical history, past surgical history, family history and social history were all reviewed and documented in the EPIC chart. 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 73 yo son, 61 yo daughter working on [employer].   ROS:  A ROS was performed and pertinent positives and negatives are included.  Exam:  Vitals:   02/16/21 1517  BP: 122/82  Weight: 150 lb (68 kg)  Height: 5\' 2"  (1.575 m)   Body mass index is 27.44 kg/m.  General appearance:  Normal Thyroid:  Symmetrical, normal in size, without palpable masses or nodularity. Respiratory  Auscultation:  Clear without wheezing or rhonchi Cardiovascular  Auscultation:  Regular rate, without rubs, murmurs or gallops  Edema/varicosities:  Not grossly evident Abdominal  Soft,nontender, without masses, guarding or rebound.  Liver/spleen:  No organomegaly noted  Hernia:  None appreciated  Skin  Inspection:  Grossly normal   Breasts: Examined lying and sitting.   Right: Without masses, retractions, discharge or axillary adenopathy.   Left: Without masses, retractions, discharge or axillary adenopathy. Gentitourinary   Inguinal/mons:  Normal without inguinal adenopathy  External genitalia:  Normal  BUS/Urethra/Skene's glands:  Normal  Vagina:  Atrophic changes  Cervix:  Normal  Uterus:  Normal in size, shape and contour.  Midline and  mobile  Adnexa/parametria:     Rt: Without masses or tenderness.   Lt: Without masses or tenderness.  Anus and perineum: Normal  Digital rectal exam: Normal sphincter tone without palpated masses or tenderness  Assessment/Plan:  50 y.o. 44 for annual exam.   Well female exam with routine gynecological exam - Plan: CBC with Differential/Platelet, Comprehensive metabolic panel. Education provided on SBEs, importance of preventative screenings, current guidelines, high calcium diet, regular exercise, and multivitamin daily.   Hyperlipidemia, unspecified hyperlipidemia type - Plan: Lipid panel  Postmenopausal - No HRT, no bleeding.   Vasomotor symptoms due to menopause - Plan: FLUoxetine (PROZAC) 20 MG capsule daily. We increased dose last year and she feels she is doing good on current dose and would like to continue. Refill x 1 year provided.   Depression, unspecified depression type - Plan: FLUoxetine (PROZAC) 20 MG capsule daily. Doing well and would like to continue.   Screening for cervical cancer - Normal Pap history.  Will repeat at 5-year interval per guidelines.  Screening for breast cancer - Normal mammogram history.  Continue annual screenings.  Normal breast exam today.  Screening for colon cancer - has not had screening colonoscopy. Educated on current guidelines and importance of preventative screenings. Information provided on [facility]. Declined Cologuard.  Screening for osteoporosis - Will plan Dexa farther into menopause.   Follow up in 1 year for annual     [name] [facility], 3:46 PM 02/16/2021

## 2021-02-17 LAB — CBC WITH DIFFERENTIAL/PLATELET
Absolute Monocytes: 508 cells/uL (ref 200–950)
Basophils Absolute: 69 cells/uL (ref 0–200)
Basophils Relative: 0.9 %
Eosinophils Absolute: 177 cells/uL (ref 15–500)
Eosinophils Relative: 2.3 %
HCT: 38 % (ref 35.0–45.0)
Hemoglobin: 12.6 g/dL (ref 11.7–15.5)
Lymphs Abs: 3265 cells/uL (ref 850–3900)
MCH: 29.4 pg (ref 27.0–33.0)
MCHC: 33.2 g/dL (ref 32.0–36.0)
MCV: 88.8 fL (ref 80.0–100.0)
MPV: 9.1 fL (ref 7.5–12.5)
Monocytes Relative: 6.6 %
Neutro Abs: 3681 cells/uL (ref 1500–7800)
Neutrophils Relative %: 47.8 %
Platelets: 347 10*3/uL (ref 140–400)
RBC: 4.28 10*6/uL (ref 3.80–5.10)
RDW: 11.8 % (ref 11.0–15.0)
Total Lymphocyte: 42.4 %
WBC: 7.7 10*3/uL (ref 3.8–10.8)

## 2021-02-17 LAB — COMPREHENSIVE METABOLIC PANEL
AG Ratio: 1.9 (calc) (ref 1.0–2.5)
ALT: 15 U/L (ref 6–29)
AST: 18 U/L (ref 10–35)
Albumin: 4.5 g/dL (ref 3.6–5.1)
Alkaline phosphatase (APISO): 62 U/L (ref 37–153)
BUN: 12 mg/dL (ref 7–25)
CO2: 28 mmol/L (ref 20–32)
Calcium: 9.5 mg/dL (ref 8.6–10.4)
Chloride: 101 mmol/L (ref 98–110)
Creat: 0.87 mg/dL (ref 0.50–1.03)
Globulin: 2.4 g/dL (calc) (ref 1.9–3.7)
Glucose, Bld: 87 mg/dL (ref 65–99)
Potassium: 4.1 mmol/L (ref 3.5–5.3)
Sodium: 137 mmol/L (ref 135–146)
Total Bilirubin: 0.5 mg/dL (ref 0.2–1.2)
Total Protein: 6.9 g/dL (ref 6.1–8.1)

## 2021-02-17 LAB — LIPID PANEL
Cholesterol: 242 mg/dL — ABNORMAL HIGH (ref <200)
HDL: 85 mg/dL (ref >=50)
LDL Cholesterol (Calc): 139 mg/dL (calc) — ABNORMAL HIGH
Non-HDL Cholesterol (Calc): 157 mg/dL (calc) — ABNORMAL HIGH (ref <130)
Total CHOL/HDL Ratio: 2.8 (calc) (ref <5.0)
Triglycerides: 85 mg/dL (ref <150)

## 2021-02-26 ENCOUNTER — Other Ambulatory Visit: Payer: Self-pay | Admitting: Nurse Practitioner

## 2021-02-26 DIAGNOSIS — N951 Menopausal and female climacteric states: Secondary | ICD-10-CM

## 2021-02-26 DIAGNOSIS — F32A Depression, unspecified: Secondary | ICD-10-CM

## 2021-02-26 NOTE — Telephone Encounter (Signed)
RX request for Prozac 20 mg Last annual 02/16/2021 I will route to Provider for approval

## 2021-03-10 ENCOUNTER — Telehealth: Payer: Self-pay | Admitting: Podiatry

## 2021-03-10 NOTE — Telephone Encounter (Signed)
Patient called requesting a refill on pain medication  Please advise.Marland Kitchen

## 2021-03-11 ENCOUNTER — Other Ambulatory Visit: Payer: Self-pay | Admitting: Podiatry

## 2021-03-11 MED ORDER — MELOXICAM 15 MG PO TABS: 15.0000 mg | ORAL_TABLET | Freq: Every day | ORAL | 1 refills | Status: DC

## 2021-03-11 NOTE — Telephone Encounter (Signed)
Prescription for meloxicam 15 mg daily sent to the pharmacy.  Thanks, Dr. Logan Bores

## 2021-03-12 DIAGNOSIS — Z23 Encounter for immunization: Secondary | ICD-10-CM | POA: Diagnosis not present

## 2021-03-16 ENCOUNTER — Other Ambulatory Visit: Payer: Self-pay | Admitting: Podiatry

## 2021-03-16 MED ORDER — MELOXICAM 15 MG PO TABS: 15.0000 mg | ORAL_TABLET | Freq: Every day | ORAL | 1 refills | Status: DC

## 2021-08-19 DIAGNOSIS — M79641 Pain in right hand: Secondary | ICD-10-CM | POA: Diagnosis not present

## 2021-08-19 DIAGNOSIS — M19041 Primary osteoarthritis, right hand: Secondary | ICD-10-CM | POA: Diagnosis not present

## 2021-08-19 DIAGNOSIS — M79642 Pain in left hand: Secondary | ICD-10-CM | POA: Diagnosis not present

## 2021-08-19 DIAGNOSIS — M19042 Primary osteoarthritis, left hand: Secondary | ICD-10-CM | POA: Diagnosis not present

## 2021-10-19 DIAGNOSIS — Z20822 Contact with and (suspected) exposure to covid-19: Secondary | ICD-10-CM | POA: Diagnosis not present

## 2021-10-19 DIAGNOSIS — R051 Acute cough: Secondary | ICD-10-CM | POA: Diagnosis not present

## 2021-10-19 DIAGNOSIS — J020 Streptococcal pharyngitis: Secondary | ICD-10-CM | POA: Diagnosis not present

## 2021-11-04 IMAGING — MG MM DIGITAL SCREENING BILAT W/ TOMO AND CAD
8 series · 8 of 24 positions shown · non-contrast
Comparison: Previous exam(s).

CLINICAL DATA: Screening.

EXAM:
DIGITAL SCREENING BILATERAL MAMMOGRAM WITH TOMOSYNTHESIS AND CAD
TECHNIQUE: Bilateral screening digital craniocaudal and mediolateral oblique
mammograms were obtained. Bilateral screening digital breast
tomosynthesis was performed. The images were evaluated with
computer-aided detection.

[L MLO synth-2D]
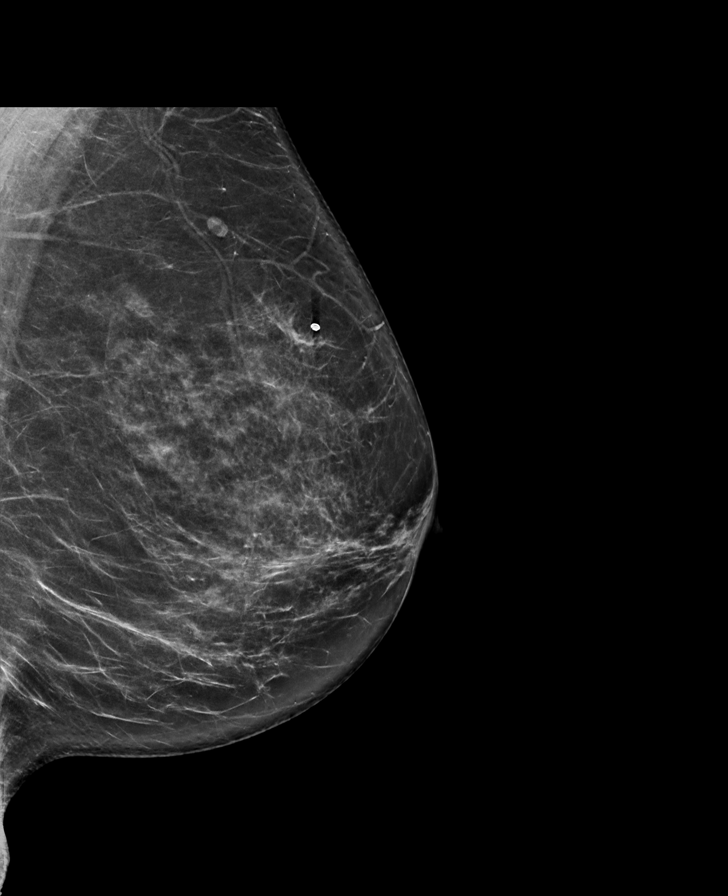

[R CC synth-2D]
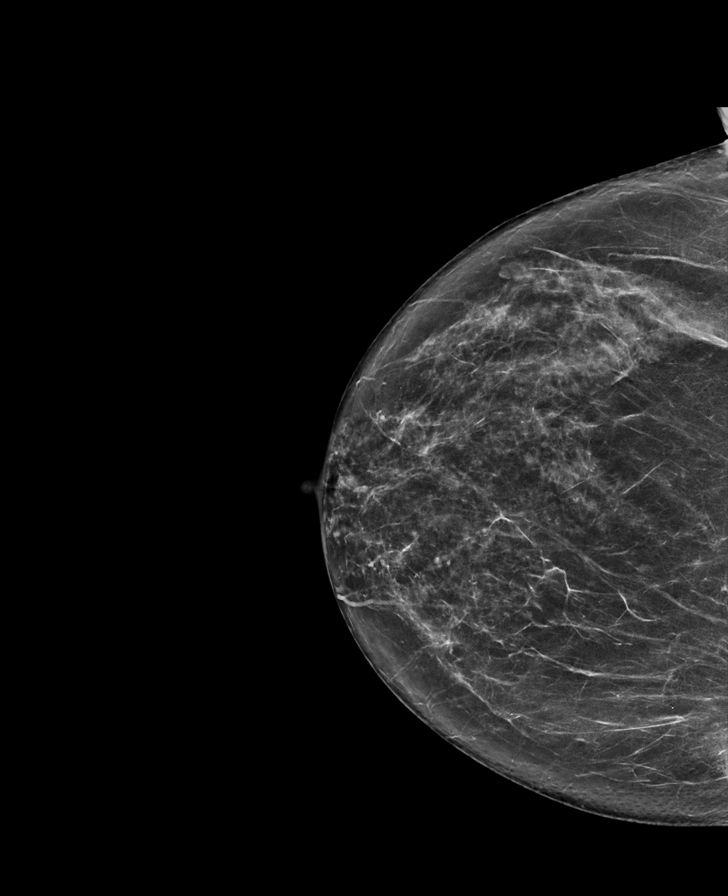

[L CC synth-2D]
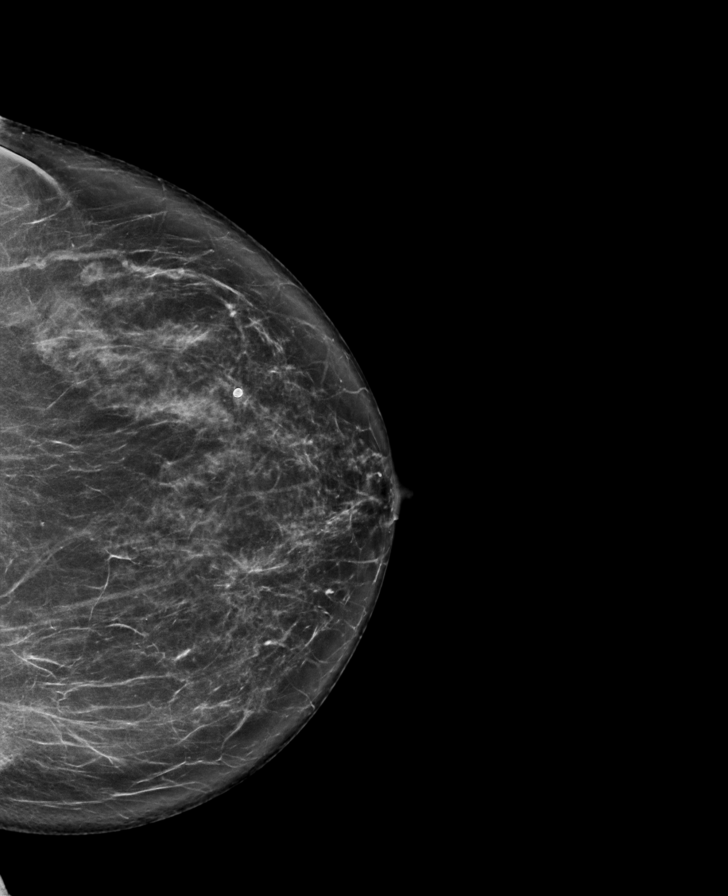

[R MLO synth-2D]
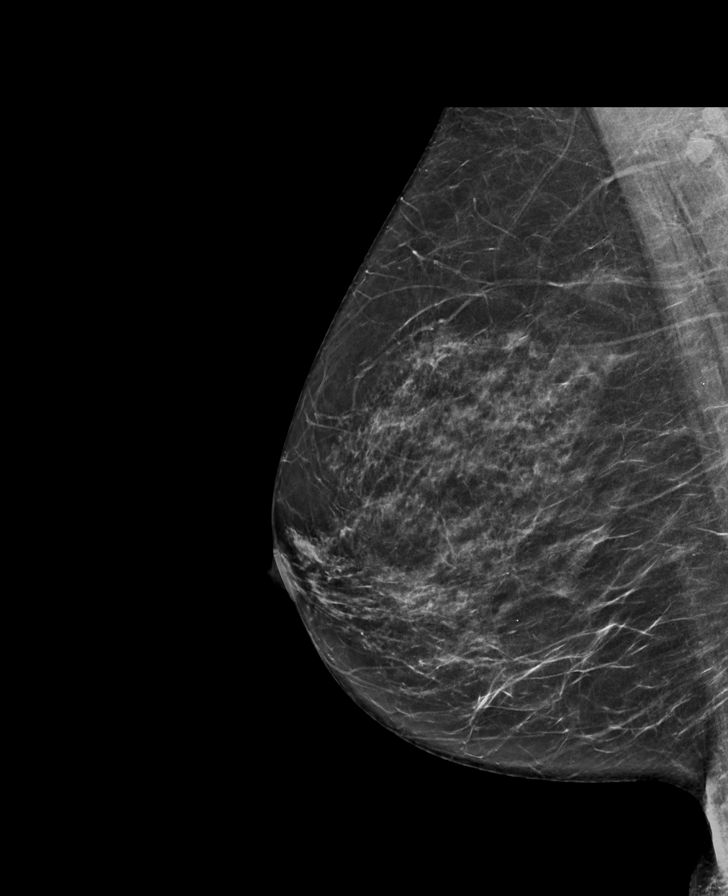

[R CC tomo · tomo slice 39/76.0]
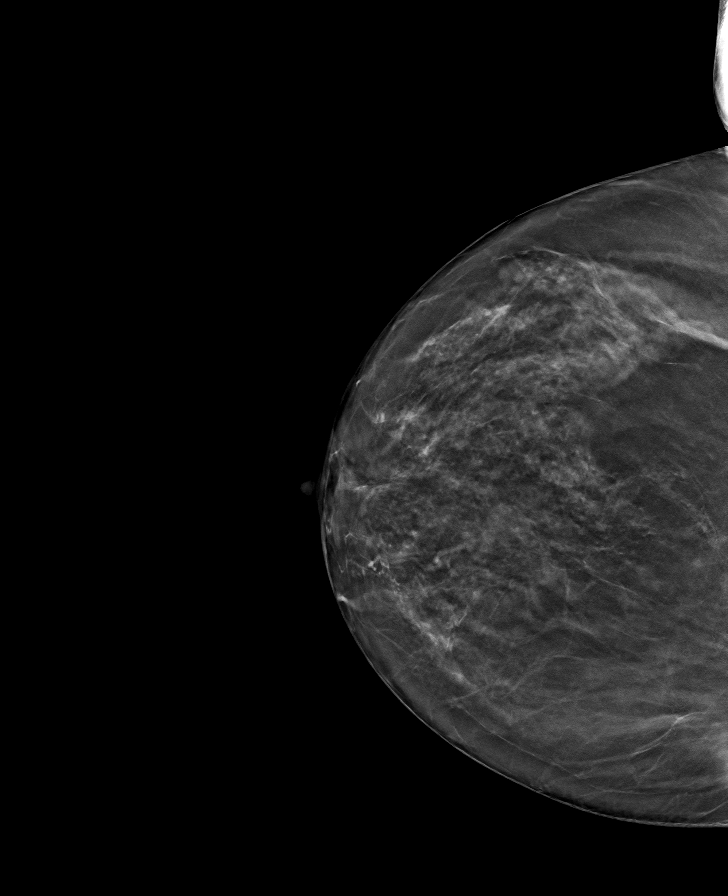

[L CC tomo · tomo slice 40/79.0]
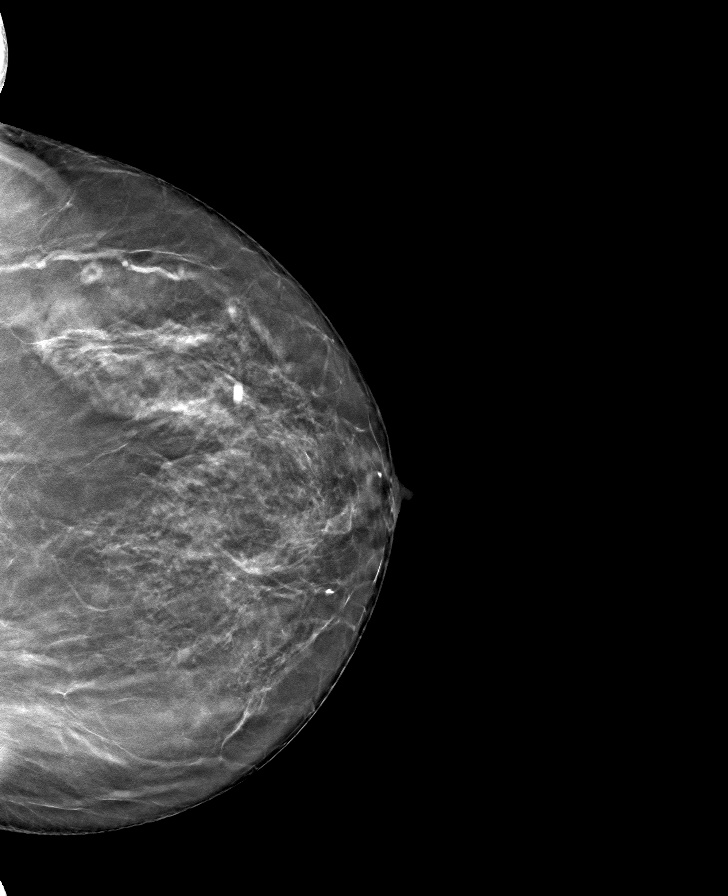

[R MLO tomo · tomo slice 36/71.0]
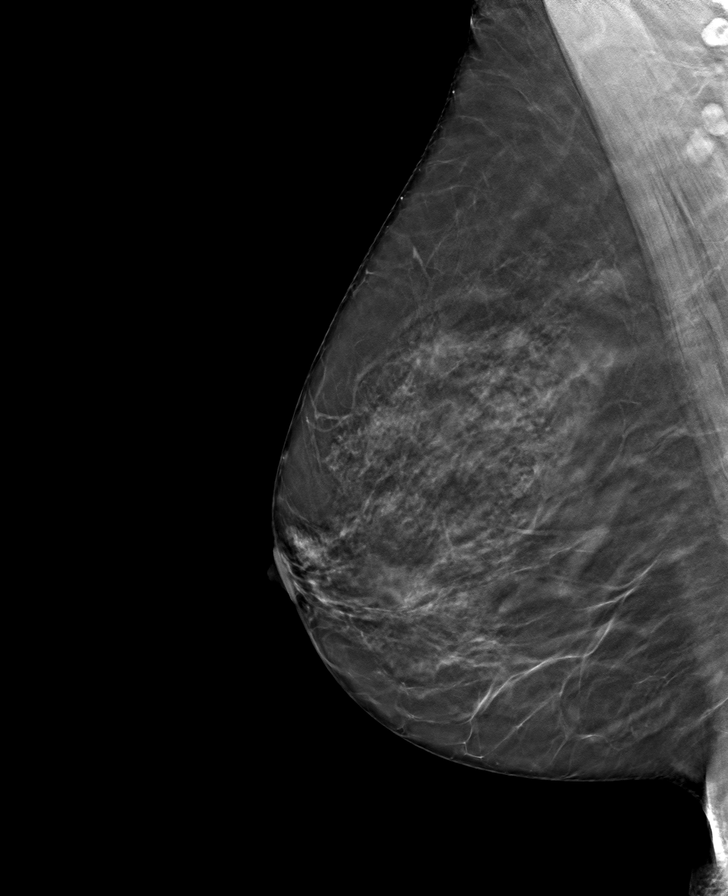

[L MLO tomo · tomo slice 40/79.0]
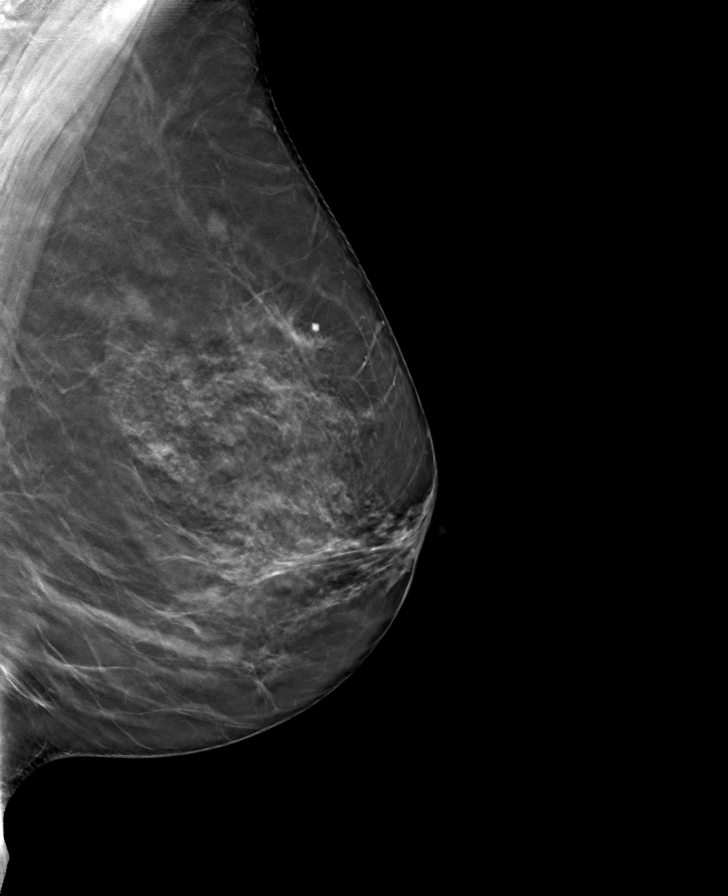

[8 of 24 positions shown; findings below may reference images not displayed]

ACR Breast Density Category b: There are scattered areas of
fibroglandular density.
FINDINGS: There are no findings suspicious for malignancy.
IMPRESSION: No mammographic evidence of malignancy. A result letter of this
screening mammogram will be mailed directly to the patient.

RECOMMENDATION:
Screening mammogram in one year. (Code:51-O-LD2)

BI-RADS CATEGORY  1: Negative.

## 2022-01-18 ENCOUNTER — Telehealth: Payer: Self-pay | Admitting: Obstetrics & Gynecology

## 2022-01-18 NOTE — Telephone Encounter (Signed)
Pt stated she spoke with Dr. Posey Rea and was told that she will be accepted as a new patient. I just wanted to confirm this information.    Please advise

## 2022-02-01 NOTE — Telephone Encounter (Signed)
Okay.  Thanks.

## 2022-04-07 ENCOUNTER — Ambulatory Visit (INDEPENDENT_AMBULATORY_CARE_PROVIDER_SITE_OTHER): Payer: BC Managed Care – PPO | Admitting: Nurse Practitioner

## 2022-04-07 ENCOUNTER — Encounter: Payer: Self-pay | Admitting: Nurse Practitioner

## 2022-04-07 VITALS — BP 120/78 | Ht 62.0 in | Wt 153.0 lb

## 2022-04-07 DIAGNOSIS — Z01419 Encounter for gynecological examination (general) (routine) without abnormal findings: Secondary | ICD-10-CM | POA: Diagnosis not present

## 2022-04-07 DIAGNOSIS — E785 Hyperlipidemia, unspecified: Secondary | ICD-10-CM

## 2022-04-07 DIAGNOSIS — E559 Vitamin D deficiency, unspecified: Secondary | ICD-10-CM

## 2022-04-07 DIAGNOSIS — R6882 Decreased libido: Secondary | ICD-10-CM

## 2022-04-07 DIAGNOSIS — F32A Depression, unspecified: Secondary | ICD-10-CM | POA: Diagnosis not present

## 2022-04-07 LAB — CBC WITH DIFFERENTIAL/PLATELET
Absolute Monocytes: 552 cells/uL (ref 200–950)
Basophils Absolute: 72 cells/uL (ref 0–200)
Eosinophils Relative: 4.5 %
HCT: 34.8 % — ABNORMAL LOW (ref 35.0–45.0)
Hemoglobin: 11.9 g/dL (ref 11.7–15.5)
MCH: 29.8 pg (ref 27.0–33.0)
Neutro Abs: 3336 cells/uL (ref 1500–7800)
Neutrophils Relative %: 41.7 %
Total Lymphocyte: 46 %

## 2022-04-07 MED ORDER — BUPROPION HCL ER (XL) 150 MG PO TB24: 150.0000 mg | ORAL_TABLET | Freq: Every day | ORAL | 1 refills | Status: DC

## 2022-04-07 NOTE — Progress Notes (Unsigned)
Janet Carney 1971-05-07 [id number]   History:  51 y.o. [id number] presents for annual exam. Postmenopausal - no HRT, no bleeding. Normal pap and mammogram history. Doing well on Prozac for depression but has significant decrease in libido since starting. It is affecting her relationship and she is wondering if there is a different medication she can try. History of Vitamin D deficiency.   Gynecologic History No LMP recorded. (Menstrual status: Postmenopausal).   Contraception: post menopausal status Sexually active: Yes  Health Maintenance Last Pap: 02/13/2019. Results were: Normal neg HPV Last mammogram: 01/09/2021. Results were: Normal Last colonoscopy: Never Last Dexa: Not indicated  Past medical history, past surgical history, family history and social history were all reviewed and documented in the EPIC chart. 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   ROS:  A ROS was performed and pertinent positives and negatives are included.  Exam:  Vitals:   04/07/22 1558  BP: 120/78  Weight: 153 lb (69.4 kg)  Height: 5\' 2"  (1.575 m)    Body mass index is 27.98 kg/m.  General appearance:  Normal Thyroid:  Symmetrical, normal in size, without palpable masses or nodularity. Respiratory  Auscultation:  Clear without wheezing or rhonchi Cardiovascular  Auscultation:  Regular rate, without rubs, murmurs or gallops  Edema/varicosities:  Not grossly evident Abdominal  Soft,nontender, without masses, guarding or rebound.  Liver/spleen:  No organomegaly noted  Hernia:  None appreciated  Skin  Inspection:  Grossly normal   Breasts: Examined lying and sitting.   Right: Without masses, retractions, discharge or axillary adenopathy.   Left: Without masses, retractions, discharge or axillary adenopathy. Gentitourinary   Inguinal/mons:  Normal without inguinal adenopathy  External genitalia:  Normal  BUS/Urethra/Skene's  glands:  Normal  Vagina:  Atrophic changes  Cervix:  Normal  Uterus:  Normal in size, shape and contour.  Midline and mobile  Adnexa/parametria:     Rt: Without masses or tenderness.   Lt: Without masses or tenderness.  Anus and perineum: Normal  Digital rectal exam: Normal sphincter tone without palpated masses or tenderness  Patient informed chaperone available to be present for breast and pelvic exam. Patient has requested no chaperone to be present. Patient has been advised what will be completed during breast and pelvic exam.    Assessment/Plan:  51 y.o. 40 for annual exam.   Well female exam with routine gynecological exam - Plan: CBC with Differential/Platelet, Comprehensive metabolic panel. Education provided on SBEs, importance of preventative screenings, current guidelines, high calcium diet, regular exercise, and multivitamin daily.   Hyperlipidemia, unspecified hyperlipidemia type - Plan: Lipid panel  Postmenopausal - No HRT, no bleeding.   Depression, unspecified depression type - Plan: buPROPion (WELLBUTRIN XL) 150 MG 24 hr tablet daily. Will wean off Prozac while adding in Wellbutrin. She is aware of possible side effects. Mychart message sent with detailed instructions on wean process.   Decreased libido - Plan: buPROPion (WELLBUTRIN XL) 150 MG 24 hr tablet daily. Decreased libido since starting Prozac. It is affecting her relationship and she is hoping to try something different. Will switch to Wellbutrin.   Screening for cervical cancer - Normal Pap history.  Will repeat at 5-year interval per guidelines.  Screening for breast cancer - Normal mammogram history. Overdue and encouraged to schedule soon. Normal breast exam today.  Screening for colon cancer - has not had screening colonoscopy. Reports PCP sent referral but she has not heard. Provided her with  information on [facility] and encouraged to call to schedule.   Screening for osteoporosis - Average risk.  Will plan Dexa at age 51.   Follow up in 1 year for annual.    Tamela Gammon [facility], 7:50 AM 04/08/2022

## 2022-04-07 NOTE — Patient Instructions (Signed)
Schedule Colonoscopy! ?[facility] ?[phone] ?[address] [place], [place] [address] ? ?

## 2022-04-08 ENCOUNTER — Encounter: Payer: Self-pay | Admitting: Nurse Practitioner

## 2022-04-08 LAB — COMPREHENSIVE METABOLIC PANEL
AG Ratio: 2 (calc) (ref 1.0–2.5)
ALT: 16 U/L (ref 6–29)
AST: 20 U/L (ref 10–35)
Albumin: 4.5 g/dL (ref 3.6–5.1)
Alkaline phosphatase (APISO): 65 U/L (ref 37–153)
BUN: 13 mg/dL (ref 7–25)
CO2: 25 mmol/L (ref 20–32)
Calcium: 9.4 mg/dL (ref 8.6–10.4)
Chloride: 102 mmol/L (ref 98–110)
Creat: 0.9 mg/dL (ref 0.50–1.03)
Globulin: 2.2 g/dL (calc) (ref 1.9–3.7)
Glucose, Bld: 73 mg/dL (ref 65–99)
Potassium: 4.4 mmol/L (ref 3.5–5.3)
Sodium: 137 mmol/L (ref 135–146)
Total Bilirubin: 0.5 mg/dL (ref 0.2–1.2)
Total Protein: 6.7 g/dL (ref 6.1–8.1)

## 2022-04-08 LAB — CBC WITH DIFFERENTIAL/PLATELET
Basophils Relative: 0.9 %
Eosinophils Absolute: 360 cells/uL (ref 15–500)
Lymphs Abs: 3680 cells/uL (ref 850–3900)
MCHC: 34.2 g/dL (ref 32.0–36.0)
MCV: 87.2 fL (ref 80.0–100.0)
MPV: 9.2 fL (ref 7.5–12.5)
Monocytes Relative: 6.9 %
Platelets: 296 10*3/uL (ref 140–400)
RBC: 3.99 10*6/uL (ref 3.80–5.10)
RDW: 11.6 % (ref 11.0–15.0)
WBC: 8 10*3/uL (ref 3.8–10.8)

## 2022-04-08 LAB — LIPID PANEL
Cholesterol: 241 mg/dL — ABNORMAL HIGH (ref <200)
HDL: 83 mg/dL (ref >=50)
LDL Cholesterol (Calc): 141 mg/dL (calc) — ABNORMAL HIGH
Non-HDL Cholesterol (Calc): 158 mg/dL (calc) — ABNORMAL HIGH (ref <130)
Total CHOL/HDL Ratio: 2.9 (calc) (ref <5.0)
Triglycerides: 71 mg/dL (ref <150)

## 2022-04-08 LAB — VITAMIN D 25 HYDROXY (VIT D DEFICIENCY, FRACTURES): Vit D, 25-Hydroxy: 36 ng/mL (ref 30–100)

## 2022-05-19 ENCOUNTER — Telehealth: Payer: Self-pay | Admitting: *Deleted

## 2022-05-19 NOTE — Telephone Encounter (Signed)
Patient called requesting to change back to Prozac 20 mg tablet. Reports the Wellbutrin makes her have headaches, nausea. Rx should be sent to [facility]

## 2022-05-20 ENCOUNTER — Other Ambulatory Visit: Payer: Self-pay | Admitting: Nurse Practitioner

## 2022-05-20 DIAGNOSIS — F32A Depression, unspecified: Secondary | ICD-10-CM

## 2022-05-20 MED ORDER — FLUOXETINE HCL 20 MG PO CAPS: 20.0000 mg | ORAL_CAPSULE | Freq: Every day | ORAL | 3 refills | Status: DC

## 2022-05-20 NOTE — Telephone Encounter (Signed)
I will send in Prozac 20 mg.   Week 1: Take Wellbutrin every other day Week 2: Take Wellbutrin every other day and add Prozac daily Week 3: Stop Wellbutrin and continue Prozac daily

## 2022-05-20 NOTE — Telephone Encounter (Signed)
Patient informed. 

## 2022-12-06 ENCOUNTER — Other Ambulatory Visit: Payer: Self-pay | Admitting: Nurse Practitioner

## 2022-12-06 DIAGNOSIS — Z1231 Encounter for screening mammogram for malignant neoplasm of breast: Secondary | ICD-10-CM

## 2022-12-09 ENCOUNTER — Ambulatory Visit
Admission: RE | Admit: 2022-12-09 | Discharge: 2022-12-09 | Disposition: A | Discharge status: Home / Self Care | Payer: BC Managed Care – PPO | Source: Ambulatory Visit | Attending: Nurse Practitioner | Admitting: Nurse Practitioner

## 2022-12-09 DIAGNOSIS — Z1231 Encounter for screening mammogram for malignant neoplasm of breast: Secondary | ICD-10-CM | POA: Diagnosis not present

## 2023-05-27 ENCOUNTER — Ambulatory Visit: Payer: BC Managed Care – PPO | Admitting: Radiology

## 2023-06-04 ENCOUNTER — Emergency Department (HOSPITAL_COMMUNITY)
Admission: EM | Admit: 2023-06-04 | Discharge: 2023-06-04 | Disposition: A | Discharge status: Home / Self Care | Payer: BC Managed Care – PPO

## 2023-06-04 ENCOUNTER — Encounter (HOSPITAL_COMMUNITY): Payer: Self-pay | Admitting: Emergency Medicine

## 2023-06-04 ENCOUNTER — Ambulatory Visit (HOSPITAL_COMMUNITY)
Admission: EM | Admit: 2023-06-04 | Discharge: 2023-06-04 | Disposition: A | Discharge status: Home / Self Care | Payer: BC Managed Care – PPO | Attending: Internal Medicine | Admitting: Internal Medicine

## 2023-06-04 ENCOUNTER — Encounter (HOSPITAL_COMMUNITY): Payer: Self-pay

## 2023-06-04 ENCOUNTER — Emergency Department (HOSPITAL_COMMUNITY): Payer: BC Managed Care – PPO

## 2023-06-04 ENCOUNTER — Other Ambulatory Visit: Payer: Self-pay

## 2023-06-04 DIAGNOSIS — I862 Pelvic varices: Secondary | ICD-10-CM | POA: Insufficient documentation

## 2023-06-04 DIAGNOSIS — M545 Low back pain, unspecified: Secondary | ICD-10-CM

## 2023-06-04 DIAGNOSIS — R1030 Lower abdominal pain, unspecified: Secondary | ICD-10-CM | POA: Diagnosis not present

## 2023-06-04 DIAGNOSIS — R109 Unspecified abdominal pain: Secondary | ICD-10-CM | POA: Diagnosis present

## 2023-06-04 LAB — POCT URINALYSIS DIP (MANUAL ENTRY)
Bilirubin, UA: NEGATIVE
Glucose, UA: NEGATIVE mg/dL
Ketones, POC UA: NEGATIVE mg/dL
Leukocytes, UA: NEGATIVE
Nitrite, UA: NEGATIVE
Protein Ur, POC: NEGATIVE mg/dL
Spec Grav, UA: <=1.005 — AB (ref 1.010–1.025)
Urobilinogen, UA: 0.2 U/dL
pH, UA: 6 (ref 5.0–8.0)

## 2023-06-04 LAB — URINALYSIS, ROUTINE W REFLEX MICROSCOPIC
Bacteria, UA: NONE SEEN
Bilirubin Urine: NEGATIVE
Glucose, UA: NEGATIVE mg/dL
Ketones, ur: NEGATIVE mg/dL
Leukocytes,Ua: NEGATIVE
Nitrite: NEGATIVE
Protein, ur: NEGATIVE mg/dL
Specific Gravity, Urine: 1.003 — ABNORMAL LOW (ref 1.005–1.030)
pH: 6 (ref 5.0–8.0)

## 2023-06-04 LAB — COMPREHENSIVE METABOLIC PANEL
ALT: 21 U/L (ref 0–44)
AST: 23 U/L (ref 15–41)
Albumin: 4 g/dL (ref 3.5–5.0)
Alkaline Phosphatase: 63 U/L (ref 38–126)
Anion gap: 11 (ref 5–15)
BUN: 13 mg/dL (ref 6–20)
CO2: 23 mmol/L (ref 22–32)
Calcium: 9.4 mg/dL (ref 8.9–10.3)
Chloride: 98 mmol/L (ref 98–111)
Creatinine, Ser: 0.82 mg/dL (ref 0.44–1.00)
GFR, Estimated: >60 mL/min (ref >60)
Glucose, Bld: 100 mg/dL — ABNORMAL HIGH (ref 70–99)
Potassium: 4.3 mmol/L (ref 3.5–5.1)
Sodium: 132 mmol/L — ABNORMAL LOW (ref 135–145)
Total Bilirubin: 0.6 mg/dL (ref <1.2)
Total Protein: 6.9 g/dL (ref 6.5–8.1)

## 2023-06-04 LAB — CBC
HCT: 37.3 % (ref 36.0–46.0)
Hemoglobin: 12.4 g/dL (ref 12.0–15.0)
MCH: 29.7 pg (ref 26.0–34.0)
MCHC: 33.2 g/dL (ref 30.0–36.0)
MCV: 89.2 fL (ref 80.0–100.0)
Platelets: 314 10*3/uL (ref 150–400)
RBC: 4.18 MIL/uL (ref 3.87–5.11)
RDW: 11.8 % (ref 11.5–15.5)
WBC: 6.4 10*3/uL (ref 4.0–10.5)
nRBC: 0 % (ref 0.0–0.2)

## 2023-06-04 LAB — LIPASE, BLOOD: Lipase: 43 U/L (ref 11–51)

## 2023-06-04 MED ORDER — DICLOFENAC SODIUM 50 MG PO TBEC: 50.0000 mg | DELAYED_RELEASE_TABLET | Freq: Three times a day (TID) | ORAL | 0 refills | Status: DC

## 2023-06-04 MED ORDER — IOHEXOL 350 MG/ML SOLN
75.0000 mL | Freq: Once | INTRAVENOUS | Status: AC | PRN
  Administered 2023-06-04: 75 mL via INTRAVENOUS

## 2023-06-04 NOTE — ED Notes (Signed)
Patient is being discharged from the Urgent Care and sent to the Emergency Department via POV . Per Ervin Knack, FNP, patient is in need of higher level of care due to Abdominal Pain. Patient is aware and verbalizes understanding of plan of care.  Vitals:   06/04/23 1339  BP: (!) 123/52  Pulse: 78  Resp: 18  Temp: 98.5 F (36.9 C)  SpO2: 95%

## 2023-06-04 NOTE — ED Provider Notes (Signed)
Janet Carney EMERGENCY DEPARTMENT AT [facility] Provider Note   CSN: [id number] Arrival date & time: 06/04/23  1414     History  Chief Complaint  Patient presents with   Abdominal Pain    Janet Carney is a 52 y.o. female with past medical history significant for migraines presents to the ED from urgent care due to lower abdominal pain that wraps around her left flank and into her back for the past several days.  UC sent patient due to concern about kidney infection or possible kidney stone.  Endorses some nausea with the pain.  She has been having normal bowel movements.  She describes her pain as a constant, cramping pain.  Patient initially thought the pain was due to lifting heavy boxes at work, but it did not improve with Aleve.  Denies vomiting, diarrhea, urinary symptoms, pelvic pain, vaginal bleeding.         Home Medications Prior to Admission medications   Medication Sig Start Date End Date Taking? Authorizing Provider  diclofenac (VOLTAREN) 50 MG EC tablet Take 1 tablet (50 mg total) by mouth 3 (three) times daily. 06/04/23  Yes Corneluis Allston R, PA-C  FLUoxetine (PROZAC) 20 MG capsule Take 1 capsule (20 mg total) by mouth daily. 05/20/22   [name], NP      Allergies    Patient has no known allergies.    Review of Systems   Review of Systems  Gastrointestinal:  Positive for abdominal pain and nausea. Negative for constipation, diarrhea and vomiting.  Genitourinary:  Positive for flank pain. Negative for dysuria, frequency, hematuria, pelvic pain, vaginal bleeding and vaginal discharge.    Physical Exam Updated Vital Signs BP 128/80 (BP Location: Right Arm)   Pulse (!) 58   Temp 97.6 F (36.4 C) (Oral)   Resp 15   Ht 5\' 4"  (1.626 m)   Wt 63.5 kg   LMP 07/09/2014   SpO2 100%   BMI 24.03 kg/m  Physical Exam Vitals and nursing note reviewed.  Constitutional:      General: She is not in acute distress.    Appearance: Normal appearance.  She is not ill-appearing or diaphoretic.  Cardiovascular:     Rate and Rhythm: Normal rate and regular rhythm.  Pulmonary:     Effort: Pulmonary effort is normal.  Abdominal:     General: Abdomen is flat. Bowel sounds are normal.     Palpations: Abdomen is soft.     Tenderness: There is abdominal tenderness in the right lower quadrant, suprapubic area and left lower quadrant. There is no right CVA tenderness or left CVA tenderness.  Musculoskeletal:     Lumbar back: Tenderness (left paraspinal region) present. No bony tenderness. Normal range of motion.  Skin:    General: Skin is warm and dry.     Capillary Refill: Capillary refill takes less than 2 seconds.  Neurological:     Mental Status: She is alert. Mental status is at baseline.  Psychiatric:        Mood and Affect: Mood normal.        Behavior: Behavior normal.     ED Results / Procedures / Treatments   Labs (all labs ordered are listed, but only abnormal results are displayed) Labs Reviewed  COMPREHENSIVE METABOLIC PANEL - Abnormal; Notable for the following components:      Result Value   Sodium 132 (*)    Glucose, Bld 100 (*)    All other components within normal limits  URINALYSIS, ROUTINE W REFLEX MICROSCOPIC - Abnormal; Notable for the following components:   Color, Urine STRAW (*)    Specific Gravity, Urine 1.003 (*)    Hgb urine dipstick SMALL (*)    All other components within normal limits  LIPASE, BLOOD  CBC    EKG None  Radiology US Pelvis Complete  Result Date: 06/04/2023 CLINICAL DATA:  Postmenopausal 52 year old with pelvic venous congestion on CT and lower abdominopelvic pain. EXAM: TRANSABDOMINAL AND TRANSVAGINAL ULTRASOUND OF PELVIS DOPPLER ULTRASOUND OF OVARIES TECHNIQUE: Both transabdominal and transvaginal ultrasound examinations of the pelvis were performed. Transabdominal technique was performed for global imaging of the pelvis including uterus, ovaries, adnexal regions, and pelvic  cul-de-sac. It was necessary to proceed with endovaginal exam following the transabdominal exam to visualize the ovaries. Color and duplex Doppler ultrasound was utilized to evaluate blood flow to the ovaries. COMPARISON:  CT with IV contrast today. FINDINGS: Uterus Measurements: Anteverted measuring 6.7 x 4.7 x 2.6 cm = volume: 43.14 mL. No fibroids or other mass visualized. Endometrium Thickness: 1.6 mm. No focal abnormality visualized. There is trace anechoic fluid within the uterine cavity. Right ovary Measurements: 1.3 x 0.7 x 1.1 cm = volume: 0.55 mL. Normal appearance/no adnexal mass. Left ovary Measurements: 1.3 x 0.6 x 1.0 cm. Normal appearance/no adnexal mass. Pulsed Doppler evaluation of both ovaries demonstrates normal low-resistance arterial and venous waveforms. There is left-greater-than-right pelvic venous congestion. Other findings No abnormal free fluid. IMPRESSION: 1. Left-greater-than-right pelvic venous congestion. 2. Trace anechoic fluid within the uterine cavity. No focal abnormality or thickening of the EM complex. 3. No evidence of ovarian torsion or mass. Electronically Signed   By: Almira Bar M.D.   On: 06/04/2023 20:52   US Transvaginal Non-OB  Result Date: 06/04/2023 CLINICAL DATA:  Postmenopausal 52 year old with pelvic venous congestion on CT and lower abdominopelvic pain. EXAM: TRANSABDOMINAL AND TRANSVAGINAL ULTRASOUND OF PELVIS DOPPLER ULTRASOUND OF OVARIES TECHNIQUE: Both transabdominal and transvaginal ultrasound examinations of the pelvis were performed. Transabdominal technique was performed for global imaging of the pelvis including uterus, ovaries, adnexal regions, and pelvic cul-de-sac. It was necessary to proceed with endovaginal exam following the transabdominal exam to visualize the ovaries. Color and duplex Doppler ultrasound was utilized to evaluate blood flow to the ovaries. COMPARISON:  CT with IV contrast today. FINDINGS: Uterus Measurements: Anteverted  measuring 6.7 x 4.7 x 2.6 cm = volume: 43.14 mL. No fibroids or other mass visualized. Endometrium Thickness: 1.6 mm. No focal abnormality visualized. There is trace anechoic fluid within the uterine cavity. Right ovary Measurements: 1.3 x 0.7 x 1.1 cm = volume: 0.55 mL. Normal appearance/no adnexal mass. Left ovary Measurements: 1.3 x 0.6 x 1.0 cm. Normal appearance/no adnexal mass. Pulsed Doppler evaluation of both ovaries demonstrates normal low-resistance arterial and venous waveforms. There is left-greater-than-right pelvic venous congestion. Other findings No abnormal free fluid. IMPRESSION: 1. Left-greater-than-right pelvic venous congestion. 2. Trace anechoic fluid within the uterine cavity. No focal abnormality or thickening of the EM complex. 3. No evidence of ovarian torsion or mass. Electronically Signed   By: Almira Bar M.D.   On: 06/04/2023 20:52   [place] Art/Ven Flow Abd Pelv Doppler  Result Date: 06/04/2023 CLINICAL DATA:  Postmenopausal 52 year old with pelvic venous congestion on CT and lower abdominopelvic pain. EXAM: TRANSABDOMINAL AND TRANSVAGINAL ULTRASOUND OF PELVIS DOPPLER ULTRASOUND OF OVARIES TECHNIQUE: Both transabdominal and transvaginal ultrasound examinations of the pelvis were performed. Transabdominal technique was performed for global imaging of the pelvis including uterus, ovaries, adnexal regions,  and pelvic cul-de-sac. It was necessary to proceed with endovaginal exam following the transabdominal exam to visualize the ovaries. Color and duplex Doppler ultrasound was utilized to evaluate blood flow to the ovaries. COMPARISON:  CT with IV contrast today. FINDINGS: Uterus Measurements: Anteverted measuring 6.7 x 4.7 x 2.6 cm = volume: 43.14 mL. No fibroids or other mass visualized. Endometrium Thickness: 1.6 mm. No focal abnormality visualized. There is trace anechoic fluid within the uterine cavity. Right ovary Measurements: 1.3 x 0.7 x 1.1 cm = volume: 0.55 mL. Normal  appearance/no adnexal mass. Left ovary Measurements: 1.3 x 0.6 x 1.0 cm. Normal appearance/no adnexal mass. Pulsed Doppler evaluation of both ovaries demonstrates normal low-resistance arterial and venous waveforms. There is left-greater-than-right pelvic venous congestion. Other findings No abnormal free fluid. IMPRESSION: 1. Left-greater-than-right pelvic venous congestion. 2. Trace anechoic fluid within the uterine cavity. No focal abnormality or thickening of the EM complex. 3. No evidence of ovarian torsion or mass. Electronically Signed   By: Almira Bar M.D.   On: 06/04/2023 20:52   CT ABDOMEN PELVIS W CONTRAST  Result Date: 06/04/2023 CLINICAL DATA:  Left lower quadrant abdominal pain. EXAM: CT ABDOMEN AND PELVIS WITH CONTRAST TECHNIQUE: Multidetector CT imaging of the abdomen and pelvis was performed using the standard protocol following bolus administration of intravenous contrast. RADIATION DOSE REDUCTION: This exam was performed according to the departmental dose-optimization program which includes automated exposure control, adjustment of the mA and/or kV according to patient size and/or use of iterative reconstruction technique. CONTRAST:  75mL OMNIPAQUE IOHEXOL 350 MG/ML SOLN COMPARISON:  None Available. FINDINGS: Lower chest: Breathing motion along the lung bases. There is some linear opacity left lung bases likely scar or atelectasis. No pleural effusion. Hepatobiliary: No focal liver abnormality is seen. No gallstones, gallbladder wall thickening, or biliary dilatation. Patent portal vein. Pancreas: Unremarkable. No pancreatic ductal dilatation or surrounding inflammatory changes. Spleen: Normal in size without focal abnormality. Adrenals/Urinary Tract: Adrenal glands are preserved. Exophytic anterior left-sided renal lesion containing macroscopic fat measuring 18 mm consistent with a angiomyolipoma. Few soft tissue elements. No separate enhancing mass or collecting system dilatation the  ureters have normal course and caliber extending down to the urinary bladder. Preserved contours of the urinary bladder. Stomach/Bowel: There is moderate debris in the stomach. Small bowel is nondilated. Large bowel has a normal course and caliber with moderate colonic stool. Normal appendix identified posterior to the cecum in the right lower quadrant. Vascular/Lymphatic: Normal caliber aorta and IVC. There is prominent left-sided ovarian vein and periuterine vessels. Please correlate for any history pelvic congestion syndrome. Overall nonspecific. No specific abnormal lymph node enlargement identified in the abdomen and pelvis. Reproductive: Uterus is present.  No separate adnexal mass. Other: No free air or free fluid. Musculoskeletal: Mild degenerative changes of the spine and pelvis. Scattered Schmorl's node changes. Pars defects at L5. No significant listhesis. IMPRESSION: Scattered moderate colonic stool. No bowel obstruction, free air or free fluid. Normal appendix. Prominent left-sided ovarian veins. 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 Electronically Signed   By: Karen Kays M.D.   On: 06/04/2023 18:46    Procedures Procedures    Medications Ordered in ED Medications  iohexol (OMNIPAQUE) 350 MG/ML injection 75 mL (75 mLs Intravenous Contrast Given 06/04/23 1824)    ED Course/ Medical Decision Making/ A&P  Medical Decision Making Amount and/or Complexity of Data Reviewed Labs: ordered. Radiology: ordered.  Risk Prescription drug management.   This patient presents to the ED with chief complaint(s) of lower abdominal pain with occasional nausea with non-contributory past medical history.  The complaint involves an extensive differential diagnosis and also carries with it a high risk of complications and morbidity.    The differential diagnosis includes kidney stone, UTI, pyelonephritis, diverticulitis, muscle strain   The initial plan is to  obtain labs, UA, imaging  Additional history obtained: Records reviewed  urgent care note from today  Initial Assessment:   On exam, patient is resting comfortably in bed and is not in acute distress.  Abdomen is soft with tenderness across lower abdomen, non-specific, but slightly worse in the LLQ.  No CVA tenderness.  Patient pointing to her low back stating her pain is on the left.  Mild reproducible pain in this region with palpation.  No distension, guarding, or rebound tenderness of the abdomen. Vitals are stable.  Patient is afebrile.   Independent ECG/labs interpretation:  The following labs were independently interpreted:  CBC without leukocytosis or anemia.  Metabolic panel with mild hyponatremia, no other electro disturbance.  Renal and hepatic function are both normal.  Lipase unremarkable. UA without evidence of infection.  Small mount of microscopic hemoglobin.  Independent visualization and interpretation of imaging: I independently visualized the following imaging with scope of interpretation limited to determining acute life threatening conditions related to emergency care: CT abdomen and pelvis, which revealed scattered moderate colonic stool, prominent left-sided ovarian veins, and incidental finding of a left renal angiomyolipoma.   Due to patient having prominent left-sided ovarian veins and her pain mainly being on the left side, will follow-up with a pelvic ultrasound with Doppler.  Patient may have pelvic congestion syndrome.   Ultrasound with left greater than right pelvic venous congestion.  There is also trace anechoic fluid within the uterine cavity, but no focal abnormality or thickening of the EM complex.  No evidence of ovarian torsion.  Disposition:   Patient symptoms may be related to pelvic venous congestion.  Advised patient to follow-up with OB/GYN as she may have pelvic congestion syndrome.  She will need further evaluation in the outpatient setting.   Recommended continuing to take Aleve or ibuprofen as needed for discomfort.  There is no emergent finding to explain patient's symptoms otherwise.  The patient has been appropriately medically screened and/or stabilized in the ED. I have low suspicion for any other emergent medical condition which would require further screening, evaluation or treatment in the ED or require inpatient management. At time of discharge the patient is hemodynamically stable and in no acute distress. I have discussed work-up results and diagnosis with patient and answered all questions. Patient is agreeable with discharge plan. We discussed strict return precautions for returning to the emergency department and they verbalized understanding.            Final Clinical Impression(s) / ED Diagnoses Final diagnoses:  Lower abdominal pain  Pelvic varices    Rx / DC Orders ED Discharge Orders          Ordered    diclofenac (VOLTAREN) 50 MG EC tablet  3 times daily        06/04/23 2106              Lenard Simmer, PA-C 06/04/23 2109    Durwin Glaze, MD 06/05/23 2028

## 2023-06-04 NOTE — ED Triage Notes (Signed)
Left back started hurting on Tuesday.  Gradually getting worse.  Today, pain is in left back and now in lower abdomen.  Denies urinary issues.    Initially, thought back pain from lifting boxes at work.    No medications taken  Last bm was this morning.  Normal per patient

## 2023-06-04 NOTE — Discharge Instructions (Addendum)
Thank you for allowing [place] to be a part of your care today.  Your workup does not demonstrate an emergent or surgical finding that would require you to stay in the hospital.  Your ultrasound did show pelvic venous congestion.  This may be the cause of your discomfort.  Please schedule a follow-up appointment with your OB/GYN for further outpatient evaluation and management.  I have prescribed a stronger anti-inflammatory for you to take up to 3 times a day to help with your symptoms.  Discontinue use if you notice abdominal discomfort or if you have changes to your stool such black, tarry stools.    Your CT scan showed an incidental finding of an angiomyolipoma to your left kidney that measures 1.8 cm.  This is a type of benign tumor that is often an incidental finding on imaging.  I recommend following up with your primary care doctor for continued monitoring, especially if this area begins to cause you symptoms.  There was also a moderate amount of stool in your colon.  For this, I recommend taking 17 g (1 capful) of MiraLAX daily mixed with 48 ounces of water or other liquid until you are able to have significant bowel movement.  You may discontinue MiraLAX after that time.  Return to the ED if you develop sudden worsening of your symptoms or if you have new concerns.

## 2023-06-04 NOTE — ED Provider Notes (Signed)
[facility]    CSN: [id number] Arrival date & time: 06/04/23  1258      History   Chief Complaint Chief Complaint  Patient presents with   Abdominal Pain    HPI Janet Carney is a 52 y.o. female.   Patient presents with approximately 4 to 5-day history of lower back pain and abdominal pain.  Reports that she has left lower back pain that seems to radiate around to her abdomen and across her abdomen.  States that she describes as a cramping and constant pain.  She denies nausea, vomiting, diarrhea.  Patient has been having daily, normal bowel movements with no blood in stool.  Denies fever or chills.  Patient is urinating appropriately and denies dysuria, urinary frequency, hematuria.  Denies any history of kidney stones or chronic gastrointestinal problems.  Denies history of chronic back pain.  Denies any injury to any of the areas. Denies vaginal bleeding.    Abdominal Pain   Past Medical History:  Diagnosis Date   Migraines     Patient Active Problem List   Diagnosis Date Noted   IBS (irritable bowel syndrome) 08/08/2013   Situational stress 06/07/2013   Migraine syndrome 01/09/2013   Helicobacter pylori infection 01/02/2007    Past Surgical History:  Procedure Laterality Date   BREAST BIOPSY  2002    OB History     Gravida  3   Para  2   Term      Preterm      AB  1   Living  2      SAB  0   IAB      Ectopic  0   Multiple      Live Births               Home Medications    Prior to Admission medications   Medication Sig Start Date End Date Taking? Authorizing Provider  FLUoxetine (PROZAC) 20 MG capsule Take 1 capsule (20 mg total) by mouth daily. 05/20/22   [name], NP    Family History Family History  Problem Relation Age of Onset   Lung cancer Father    Diabetes Father     Social History Social History   Tobacco Use   Smoking status: Never   Smokeless tobacco: Never  Vaping Use   Vaping status:  Never Used  Substance Use Topics   Alcohol use: Yes    Comment: Rare   Drug use: No     Allergies   Patient has no known allergies.   Review of Systems Review of Systems Per HPI  Physical Exam Triage Vital Signs ED Triage Vitals  Encounter Vitals Group     BP 06/04/23 1339 (!) 123/52     Systolic BP Percentile --      Diastolic BP Percentile --      Pulse Rate 06/04/23 1339 78     Resp 06/04/23 1339 18     Temp 06/04/23 1339 98.5 F (36.9 C)     Temp Source 06/04/23 1339 Oral     SpO2 06/04/23 1339 95 %     Weight --      Height --      Head Circumference --      Peak Flow --      Pain Score 06/04/23 1337 2     Pain Loc --      Pain Education --      Exclude from Growth Chart --  No data found.  Updated Vital Signs BP (!) 123/52 (BP Location: Left Arm)   Pulse 78   Temp 98.5 F (36.9 C) (Oral)   Resp 18   LMP 07/09/2014   SpO2 95%   Visual Acuity Right Eye Distance:   Left Eye Distance:   Bilateral Distance:    Right Eye Near:   Left Eye Near:    Bilateral Near:     Physical Exam Constitutional:      General: She is not in acute distress.    Appearance: Normal appearance. She is not toxic-appearing or diaphoretic.  HENT:     Head: Normocephalic and atraumatic.  Eyes:     Extraocular Movements: Extraocular movements intact.     Conjunctiva/sclera: Conjunctivae normal.  Cardiovascular:     Rate and Rhythm: Normal rate and regular rhythm.     Pulses: Normal pulses.     Heart sounds: Normal heart sounds.  Pulmonary:     Effort: Pulmonary effort is normal. No respiratory distress.     Breath sounds: Normal breath sounds.  Abdominal:     General: Bowel sounds are normal. There is no distension.     Palpations: Abdomen is soft.     Tenderness: There is abdominal tenderness.     Comments: Patient has tenderness to palpation across lower abdomen.  Musculoskeletal:       Back:     Comments: No tenderness to palpation to back but patient  reports pain occurs at circled area on diagram.  No direct spinal tenderness, crepitus, step-off noted.  Neurological:     General: No focal deficit present.     Mental Status: She is alert and oriented to person, place, and time. Mental status is at baseline.  Psychiatric:        Mood and Affect: Mood normal.        Behavior: Behavior normal.        Thought Content: Thought content normal.        Judgment: Judgment normal.      UC Treatments / Results  Labs (all labs ordered are listed, but only abnormal results are displayed) Labs Reviewed  POCT URINALYSIS DIP (MANUAL ENTRY) - Abnormal; Notable for the following components:      Result Value   Spec Grav, UA <=1.005 (*)    Blood, UA small (*)    All other components within normal limits    EKG   Radiology No results found.  Procedures Procedures (including critical care time)  Medications Ordered in UC Medications - No data to display  Initial Impression / Assessment and Plan / UC Course  I have reviewed the triage vital signs and the nursing notes.  Pertinent labs & imaging results that were available during my care of the patient were reviewed by me and considered in my medical decision making (see chart for details).     Differential diagnoses include kidney stone versus urinary tract infection versus fibroids versus ovarian cyst.  I do think that patient needs imaging of the abdomen/kidneys which cannot be performed here at the urgent care.  Patient may also need stat blood work.  Advised patient to go to the emergency department for further evaluation and management and she was agreeable with this plan.  Vital signs stable at discharge.  Agree with patient self transport to the ER. Final Clinical Impressions(s) / UC Diagnoses   Final diagnoses:  Lower abdominal pain  Acute left-sided low back pain without sciatica     Discharge  Instructions      Please go to the emergency department as soon as you leave  urgent care for further evaluation and management as I do think you need imaging of your abdomen.    ED Prescriptions   None    PDMP not reviewed this encounter.   Gustavus Bryant, [place] 06/04/23 [phone]

## 2023-06-04 NOTE — ED Triage Notes (Signed)
Pt arrived POV from home c/o lower abdominal pain that wraps around to her left flank for several days. Pt went to UC and they are concerned about a kidney infection or a possible stone. Pt endorses some nausea.

## 2023-06-04 NOTE — ED Notes (Signed)
Patient is being discharged from the Urgent Care and sent to the Emergency Department via pov . Per [place], NP, patient is in need of higher level of care due to limited resources. Patient is aware and verbalizes understanding of plan of care.  Vitals:   06/04/23 1339  BP: (!) 123/52  Pulse: 78  Resp: 18  Temp: 98.5 F (36.9 C)  SpO2: 95%

## 2023-06-04 NOTE — Discharge Instructions (Signed)
Please go to the emergency department as soon as you leave urgent care for further evaluation and management as I do think you need imaging of your abdomen.

## 2023-06-06 ENCOUNTER — Ambulatory Visit: Payer: BC Managed Care – PPO | Admitting: Nurse Practitioner

## 2023-06-06 ENCOUNTER — Encounter: Payer: Self-pay | Admitting: Nurse Practitioner

## 2023-06-06 VITALS — BP 92/62 | HR 68

## 2023-06-06 DIAGNOSIS — R102 Pelvic and perineal pain: Secondary | ICD-10-CM | POA: Diagnosis not present

## 2023-06-06 DIAGNOSIS — M545 Low back pain, unspecified: Secondary | ICD-10-CM

## 2023-06-06 NOTE — Progress Notes (Signed)
   Acute Office Visit  Subjective:    Patient ID: Janet Carney, female    DOB: 1970/08/12, 52 y.o.   MRN: [id number]   HPI 52 y.o. presents today for follow up from ER visit 06/04/23. Originally presented to urgent care for LLQ abdominal pain that radiated to back for several days. Was sent to ER for imaging. CT scan showed "Scattered moderate colonic stool. No bowel obstruction, free air or free fluid. Normal appendix. Prominent left-sided ovarian veins. 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 Left-greater-than-right pelvic venous congestion. 2. Trace anechoic fluid within the uterine cavity. No focal abnormality or thickening of the EM complex. 3. No evidence of ovarian torsion or mass". Denies bowel changes. Negative UA. Still having pain but has improved. Taking a Miralax and started PO Diclofenac last night. Does heavy lifting above head at work. Pain was worse over thanksgiving due to being on feet all day. Pain was 3/10 at it's worst, constant, achy. Does not radiate down leg.   Patient's last menstrual period was 07/09/2014.    Review of Systems  Constitutional: Negative.   Gastrointestinal:  Positive for abdominal pain (LLQ). Negative for constipation, diarrhea, nausea and vomiting.  Genitourinary:  Positive for pelvic pain. Negative for difficulty urinating, dysuria, flank pain, frequency, hematuria, urgency, vaginal bleeding, vaginal discharge and vaginal pain.  Musculoskeletal:  Positive for back pain.       Objective:    Physical Exam Constitutional:      Appearance: Normal appearance.  Abdominal:     Tenderness: There is no abdominal tenderness. There is no right CVA tenderness, left CVA tenderness, guarding or rebound.     Hernia: No hernia is present.  Musculoskeletal:       Legs:   GU: not indicated (imaging 06/04/23)  BP 92/62   Pulse 68   LMP 07/09/2014   SpO2 98%  Wt Readings from Last 3 Encounters:  06/04/23 140 lb (63.5 kg)  04/07/22  153 lb (69.4 kg)  02/16/21 150 lb (68 kg)        Patient informed chaperone available to be present for breast and/or pelvic exam. Patient has requested no chaperone to be present. Patient has been advised what will be completed during breast and pelvic exam.   Assessment & Plan:   Problem List Items Addressed This Visit   None Visit Diagnoses     Acute bilateral low back pain without sciatica    -  Primary   Pelvic pain          Plan: Reviewed CT and ultrasounds with patient - prominent left-sided ovarian veins likely an incidental finding and not cause for pain. Discussed pelvic congestion syndrome diagnosis, symptoms and lack of treatment available. No cases identified in postmenopausal women. Appears to be musculoskeletal in nature. Recommend rest, continue PO Diclofenac. Offered muscle relaxer but declines. Aware may take 4-6 weeks for full improvement. Will follow up with PCP if pain persists.    Return if symptoms worsen or fail to improve.    [name] DNP, 2:32 PM 06/06/2023

## 2023-08-06 ENCOUNTER — Other Ambulatory Visit: Payer: Self-pay | Admitting: Nurse Practitioner

## 2023-08-06 DIAGNOSIS — F32A Depression, unspecified: Secondary | ICD-10-CM

## 2023-08-08 NOTE — Telephone Encounter (Signed)
Med refill request: prozac Last AEX: 04/07/22, Last OV: 06/06/23 Next AEX: 08/23/23 Last MMG (if hormonal med) 12/09/22 Refill authorized: Please Advise, #90, 0 RF

## 2023-08-23 ENCOUNTER — Ambulatory Visit: Payer: BC Managed Care – PPO | Admitting: Nurse Practitioner

## 2023-08-23 NOTE — Progress Notes (Deleted)
   Janet Carney May 25, 1971 [id number]   History:  53 y.o. [id number] presents for annual exam. Postmenopausal - no HRT, no bleeding. Normal pap and mammogram history. Doing well on Prozac for depression but has significant decrease in libido since starting. Did not tolerate Wellbutrin. History of Vitamin D deficiency.   Gynecologic History No LMP recorded. (Menstrual status: Postmenopausal).   Contraception: post menopausal status Sexually active: Yes  Health Maintenance Last Pap: 02/13/2019. Results were: Normal neg HPV Last mammogram: 12/09/2022. Results were: Normal Last colonoscopy: Never Last Dexa: Not indicated  Past medical history, past surgical history, family history and social history were all reviewed and documented in the EPIC chart. 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   ROS:  A ROS was performed and pertinent positives and negatives are included.  Exam:  There were no vitals filed for this visit.   There is no height or weight on file to calculate BMI.  General appearance:  Normal Thyroid:  Symmetrical, normal in size, without palpable masses or nodularity. Respiratory  Auscultation:  Clear without wheezing or rhonchi Cardiovascular  Auscultation:  Regular rate, without rubs, murmurs or gallops  Edema/varicosities:  Not grossly evident Abdominal  Soft,nontender, without masses, guarding or rebound.  Liver/spleen:  No organomegaly noted  Hernia:  None appreciated  Skin  Inspection:  Grossly normal   Breasts: Examined lying and sitting.   Right: Without masses, retractions, discharge or axillary adenopathy.   Left: Without masses, retractions, discharge or axillary adenopathy. Pelvic: External genitalia:  no lesions              Urethra:  normal appearing urethra with no masses, tenderness or lesions              Bartholins and Skenes: normal                 Vagina: normal appearing vagina  with normal color and discharge, no lesions              Cervix: no lesions Bimanual Exam:  Uterus:  no masses or tenderness              Adnexa: no mass, fullness, tenderness              Rectovaginal: Deferred              Anus:  normal, no lesions  Patient informed chaperone available to be present for breast and pelvic exam. Patient has requested no chaperone to be present. Patient has been advised what will be completed during breast and pelvic exam.   Assessment/Plan:  53 y.o. [name] for annual exam.   Well female exam with routine gynecological exam - Plan: CBC with Differential/Platelet, Comprehensive metabolic panel. Education provided on SBEs, importance of preventative screenings, current guidelines, high calcium diet, regular exercise, and multivitamin daily.   Hyperlipidemia, unspecified hyperlipidemia type - Plan: Lipid panel  Postmenopausal - No HRT, no bleeding.   Screening for cervical cancer - Normal Pap history.    Screening for breast cancer - Normal mammogram history. Continue annual screenings. Normal breast exam today.  Screening for colon cancer -   Screening for osteoporosis - Average risk. Will plan Dexa at age 53.   No follow-ups on file.    [name] [facility], 2:40 PM 08/23/2023

## 2023-11-29 ENCOUNTER — Other Ambulatory Visit: Payer: Self-pay | Admitting: Nurse Practitioner

## 2023-11-29 DIAGNOSIS — F32A Depression, unspecified: Secondary | ICD-10-CM

## 2023-11-29 NOTE — Telephone Encounter (Signed)
 Med refill request: Prozac   Last AEX: 04/07/22 last OV 06/06/23  Next AEX: not scheduled, message sent to FD  Last MMG (if hormonal med) Refill authorized: Last rx 08/08/23 #90 with 0 refills. Please approve or deny

## 2023-12-14 ENCOUNTER — Other Ambulatory Visit: Payer: Self-pay | Admitting: Nurse Practitioner

## 2023-12-14 DIAGNOSIS — Z1231 Encounter for screening mammogram for malignant neoplasm of breast: Secondary | ICD-10-CM

## 2023-12-26 ENCOUNTER — Ambulatory Visit
Admission: RE | Admit: 2023-12-26 | Discharge: 2023-12-26 | Disposition: A | Discharge status: Home / Self Care | Source: Ambulatory Visit | Attending: Nurse Practitioner

## 2023-12-26 DIAGNOSIS — Z1231 Encounter for screening mammogram for malignant neoplasm of breast: Secondary | ICD-10-CM

## 2024-01-16 ENCOUNTER — Ambulatory Visit: Payer: Self-pay | Admitting: Nurse Practitioner

## 2024-01-16 NOTE — Progress Notes (Deleted)
Janet Carney 02-Jan-1971 [id number]   History:  53 y.o. [id number] presents for annual exam. Postmenopausal - no HRT, no bleeding. Normal pap and mammogram history. Doing well on Prozac  for depression. Feels it affects her libido, so she tried Wellbutrin  last year but did not tolerate. History of Vitamin D  deficiency.   Gynecologic History No LMP recorded. (Menstrual status: Postmenopausal).   Contraception: post menopausal status Sexually active: Yes  Health Maintenance Last Pap: 02/13/2019. Results were: Normal neg HPV Last mammogram: 12/26/2023. Results were: Normal Last colonoscopy: Never Last Dexa: Not indicated      No data to display           Past medical history, past surgical history, family history and social history were all reviewed and documented in the EPIC chart. 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   ROS:  A ROS was performed and pertinent positives and negatives are included.  Exam:  There were no vitals filed for this visit.   There is no height or weight on file to calculate BMI.  General appearance:  Normal Thyroid:  Symmetrical, normal in size, without palpable masses or nodularity. Respiratory  Auscultation:  Clear without wheezing or rhonchi Cardiovascular  Auscultation:  Regular rate, without rubs, murmurs or gallops  Edema/varicosities:  Not grossly evident Abdominal  Soft,nontender, without masses, guarding or rebound.  Liver/spleen:  No organomegaly noted  Hernia:  None appreciated  Skin  Inspection:  Grossly normal   Breasts: Examined lying and sitting.   Right: Without masses, retractions, discharge or axillary adenopathy.   Left: Without masses, retractions, discharge or axillary adenopathy. Pelvic: External genitalia:  no lesions              Urethra:  normal appearing urethra with no masses, tenderness or lesions              Bartholins and Skenes: normal                  Vagina: normal appearing vagina with normal color and discharge, no lesions              Cervix: no lesions Bimanual Exam:  Uterus:  no masses or tenderness              Adnexa: no mass, fullness, tenderness              Rectovaginal: Deferred              Anus:  normal, no lesions   Assessment/Plan:  53 y.o. [id number] for annual exam.   Well female exam with routine gynecological exam - Plan: CBC with Differential/Platelet, Comprehensive metabolic panel. Education provided on SBEs, importance of preventative screenings, current guidelines, high calcium diet, regular exercise, and multivitamin daily.   Hyperlipidemia, unspecified hyperlipidemia type - Plan: Lipid panel  Postmenopausal - No HRT, no bleeding.    Screening for cervical cancer - Normal Pap history.  Will repeat at 5-year interval per guidelines.  Screening for breast cancer - Normal mammogram history. Overdue and encouraged to schedule soon. Normal breast exam today.  Screening for colon cancer - has not had screening colonoscopy. Reports PCP sent referral but she has not heard. Provided her with information on [facility] and encouraged to call to schedule.   Screening for osteoporosis - Average risk. Will plan Dexa at age 67.   No follow-ups on file.    Karam Dunson Janet Carney [facility], 6:34 AM  01/16/2024 

## 2024-04-03 ENCOUNTER — Ambulatory Visit: Payer: Self-pay | Admitting: Nurse Practitioner

## 2024-04-03 NOTE — Progress Notes (Deleted)
   Janet Carney 05-31-1971 [id number]   History:  53 y.o. [id number] presents for annual exam. Postmenopausal - no HRT, no bleeding. Normal pap and mammogram history. History of Vitamin D  deficiency.   Gynecologic History No LMP recorded. (Menstrual status: Postmenopausal).   Contraception: post menopausal status Sexually active: Yes  Health Maintenance Last Pap: 02/13/2019. Results were: Normal neg HPV Last mammogram: 12/26/2023. Results were: Normal Last colonoscopy: Never Last Dexa: Not indicated      No data to display           Past medical history, past surgical history, family history and social history were all reviewed and documented in the EPIC chart. 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   ROS:  A ROS was performed and pertinent positives and negatives are included.  Exam:  There were no vitals filed for this visit.   There is no height or weight on file to calculate BMI.  General appearance:  Normal Thyroid:  Symmetrical, normal in size, without palpable masses or nodularity. Respiratory  Auscultation:  Clear without wheezing or rhonchi Cardiovascular  Auscultation:  Regular rate, without rubs, murmurs or gallops  Edema/varicosities:  Not grossly evident Abdominal  Soft,nontender, without masses, guarding or rebound.  Liver/spleen:  No organomegaly noted  Hernia:  None appreciated  Skin  Inspection:  Grossly normal   Breasts: Examined lying and sitting.   Right: Without masses, retractions, discharge or axillary adenopathy.   Left: Without masses, retractions, discharge or axillary adenopathy. Pelvic: External genitalia:  no lesions              Urethra:  normal appearing urethra with no masses, tenderness or lesions              Bartholins and Skenes: normal                 Vagina: normal appearing vagina with normal color and discharge, no lesions. Atrophic changes               Cervix: no lesions Bimanual Exam:  Uterus:  no masses or tenderness              Adnexa: no mass, fullness, tenderness              Rectovaginal: Deferred              Anus:  normal, no lesions  Assessment/Plan:  53 y.o. [id number] for annual exam.   Well female exam with routine gynecological exam - Plan: CBC with Differential/Platelet, Comprehensive metabolic panel. Education provided on SBEs, importance of preventative screenings, current guidelines, high calcium diet, regular exercise, and multivitamin daily.   Hyperlipidemia, unspecified hyperlipidemia type - Plan: Lipid panel  Postmenopausal - No HRT, no bleeding.   Depression, unspecified depression type -   Screening for cervical cancer - Normal Pap history.  Will repeat at 5-year interval per guidelines.  Screening for breast cancer - Normal mammogram history. Continue annual screenings. Normal breast exam today.  Screening for colon cancer - has not had screening colonoscopy.   Screening for osteoporosis - Average risk. Will plan Dexa at age 3.   No follow-ups on file.     [name] [facility], 1:29 PM 04/03/2024

## 2024-04-09 ENCOUNTER — Ambulatory Visit (INDEPENDENT_AMBULATORY_CARE_PROVIDER_SITE_OTHER)

## 2024-04-09 ENCOUNTER — Ambulatory Visit: Admitting: Podiatry

## 2024-04-09 DIAGNOSIS — M7751 Other enthesopathy of right foot: Secondary | ICD-10-CM | POA: Diagnosis not present

## 2024-04-09 DIAGNOSIS — M7752 Other enthesopathy of left foot: Secondary | ICD-10-CM | POA: Diagnosis not present

## 2024-04-09 DIAGNOSIS — M65972 Unspecified synovitis and tenosynovitis, left ankle and foot: Secondary | ICD-10-CM | POA: Diagnosis not present

## 2024-04-09 MED ORDER — MELOXICAM 15 MG PO TABS: 15.0000 mg | ORAL_TABLET | Freq: Every day | ORAL | 1 refills | Status: DC

## 2024-04-09 NOTE — Progress Notes (Unsigned)
   Subjective: 53 y.o. female presenting as a new patient for evaluation of heel pain bilateral.  Patient has a history of bilateral heel pain for several years now.  She was previously diagnosed by Dr. Charlena, local podiatrist, for plantar fasciitis.  She received an injection in the past which did help significantly for a few months however she is looking for other alternatives besides the injections. Patient also states that she has a discolored toenail to the left hallux with thickening concerning for toenail fungus.  She also explains that she has intermittent itching to the bilateral feet especially during the summer when her feet sweat a lot.  Patient works at [employer] on her feet all day   Past Medical History:  Diagnosis Date   Benign tumor of kidney    Migraines      Objective: Physical Exam General: The patient is alert and oriented x3 in no acute distress.  Dermatology: Skin is warm, dry and supple bilateral lower extremities. Negative for open lesions or macerations bilateral.  Hyperkeratotic dystrophic nail noted to the left hallux nail plate  Vascular: Dorsalis Pedis and Posterior Tibial pulses palpable bilateral.  Capillary fill time is immediate to all digits.  Neurological: Epicritic and protective threshold intact bilateral.   Musculoskeletal: Tenderness to palpation to the plantar aspect of the bilateral heels along the plantar fascia. All other joints range of motion within normal limits bilateral. Strength 5/5 in all groups bilateral.   Radiographic exam: Normal osseous mineralization. Joint spaces preserved. No fracture/dislocation/boney destruction. No other soft tissue abnormalities or radiopaque foreign bodies.   Assessment: 1. plantar fasciitis bilateral feet 2.  Tinea pedis bilateral 3.  Onychomycosis of toenail left  Plan of Care:  1. Patient evaluated. Xrays reviewed.   2.  Patient has had injections before but she is looking for other alternatives other  than steroid injections 3.  Continue Brooks running shoes and OTC arch supports 4.  Prescription for meloxicam  15 mg daily 5.  Today we discussed different treatment options for toenail fungus including oral, topical, and antifungal laser treatment modalities.  Patient opts for oral antifungal medication.  She denies a history of liver pathology or symptoms.  Prescription for Lamisil  250 mg #90 daily 6.  Prescription for Lotrisone  cream apply 2 times daily 7.  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 Thresa EMERSON Sar, DPM    [address] [place], [place] [address]                Office [phone]  Fax [phone]

## 2024-04-10 DIAGNOSIS — M65972 Unspecified synovitis and tenosynovitis, left ankle and foot: Secondary | ICD-10-CM | POA: Diagnosis not present

## 2024-04-10 MED ORDER — BETAMETHASONE SOD PHOS & ACET 6 (3-3) MG/ML IJ SUSP
3.0000 mg | Freq: Once | INTRAMUSCULAR | Status: AC
  Administered 2024-04-10: 3 mg via INTRA_ARTICULAR

## 2024-04-30 ENCOUNTER — Ambulatory Visit: Admitting: Podiatry

## 2024-04-30 ENCOUNTER — Encounter: Payer: Self-pay | Admitting: Podiatry

## 2024-04-30 DIAGNOSIS — M7752 Other enthesopathy of left foot: Secondary | ICD-10-CM | POA: Diagnosis not present

## 2024-04-30 NOTE — Progress Notes (Signed)
   Chief Complaint  Patient presents with   Follow-up    Patient stated that everything has been fine since last visit no pain     Subjective: 53 y.o. female presenting today for follow-up evaluation of pain and tenderness associated to the bilateral feet.  Ongoing for several months.  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  Currently she says that she no longer has any pain or tenderness associated to the feet and is doing very well  Past Medical History:  Diagnosis Date   Benign tumor of kidney    Migraines      Objective: Physical Exam General: The patient is alert and oriented x3 in no acute distress.  Dermatology: Skin is warm, dry and supple bilateral lower extremities. Negative for open lesions or macerations bilateral.  Hyperkeratotic dystrophic nail noted to the left hallux nail plate  Vascular: Dorsalis Pedis and Posterior Tibial pulses palpable bilateral.  Capillary fill time is immediate to all digits.  Neurological: The intact via light touch  Musculoskeletal: Negative for any appreciable tenderness to palpation around the fifth metatarsal phalangeal joint of the left foot.  It is significantly enlarged compared to the contralateral foot.  Consistent with a bursitis around the fifth MTP  Radiographic exam B/L feet 04/09/2024: Normal osseous mineralization. Joint spaces preserved.  No osseous irregularities.  Soft tissue swelling noted around the fifth MTP of the left foot compared to the contralateral foot  Assessment: 1.  Bursitis fifth MTP left; resolved   Plan of Care:  -Patient evaluated.  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   Thresa EMERSON Sar, DPM [facility]  Dr. Thresa EMERSON Sar, DPM    [address] [place], [place] [address]                Office [phone]  Fax [phone]

## 2024-06-08 ENCOUNTER — Other Ambulatory Visit (HOSPITAL_COMMUNITY)
Admission: RE | Admit: 2024-06-08 | Discharge: 2024-06-08 | Disposition: A | Discharge status: Home / Self Care | Source: Ambulatory Visit | Attending: Nurse Practitioner | Admitting: Nurse Practitioner

## 2024-06-08 ENCOUNTER — Ambulatory Visit: Admitting: Nurse Practitioner

## 2024-06-08 ENCOUNTER — Encounter: Payer: Self-pay | Admitting: Nurse Practitioner

## 2024-06-08 VITALS — BP 112/68 | HR 74 | Ht 62.5 in | Wt 150.0 lb

## 2024-06-08 DIAGNOSIS — Z1211 Encounter for screening for malignant neoplasm of colon: Secondary | ICD-10-CM

## 2024-06-08 DIAGNOSIS — Z780 Asymptomatic menopausal state: Secondary | ICD-10-CM | POA: Diagnosis not present

## 2024-06-08 DIAGNOSIS — Z01419 Encounter for gynecological examination (general) (routine) without abnormal findings: Secondary | ICD-10-CM | POA: Diagnosis not present

## 2024-06-08 DIAGNOSIS — F32A Depression, unspecified: Secondary | ICD-10-CM | POA: Diagnosis not present

## 2024-06-08 DIAGNOSIS — Z1331 Encounter for screening for depression: Secondary | ICD-10-CM | POA: Diagnosis not present

## 2024-06-08 DIAGNOSIS — Z124 Encounter for screening for malignant neoplasm of cervix: Secondary | ICD-10-CM

## 2024-06-08 MED ORDER — FLUOXETINE HCL 20 MG PO CAPS: 20.0000 mg | ORAL_CAPSULE | ORAL | 1 refills | Status: AC

## 2024-06-08 NOTE — Progress Notes (Signed)
 Janet Carney [birth year] [id number]   History:  53 y.o. [id number] presents for annual exam. Postmenopausal - no HRT, no bleeding. Normal pap history. Paxil for depression. Doing well on History of Vitamin D  deficiency.   Gynecologic History No LMP recorded. (Menstrual status: Postmenopausal).   Contraception: post menopausal status Sexually active: Yes  Health Maintenance Last Pap: 02/13/2019. Results were: Normal neg HPV Last mammogram: 12/26/2023. Results were: Normal Last colonoscopy: Never Last Dexa: Not indicated     06/08/2024    2:01 PM  Depression screen PHQ 2/9  Decreased Interest 0  Down, Depressed, Hopeless 0  PHQ - 2 Score 0     Past medical history, past surgical history, family history and social history were all reviewed and documented in the EPIC chart. 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 Janet Carney, Janet Carney working on event organiser. Janet Carney had prostatectomy in February, doing well.   ROS:  A ROS was performed and pertinent positives and negatives are included.  Exam:  Vitals:   06/08/24 1357  BP: 112/68  Pulse: 74  SpO2: 97%  Weight: 150 lb (68 kg)  Height: 5' 2.5 (1.588 m)     Body mass index is 27 kg/m.  General appearance:  Normal Thyroid:  Symmetrical, normal in size, without palpable masses or nodularity. Respiratory  Auscultation:  Clear without wheezing or rhonchi Cardiovascular  Auscultation:  Regular rate, without rubs, murmurs or gallops  Edema/varicosities:  Not grossly evident Abdominal  Soft,nontender, without masses, guarding or rebound.  Liver/spleen:  No organomegaly noted  Hernia:  None appreciated  Skin  Inspection:  Grossly normal   Breasts: Examined lying and sitting.   Right: Without masses, retractions, discharge or axillary adenopathy.   Left: Without masses, retractions, discharge or axillary adenopathy. Pelvic: External genitalia:  no lesions              Urethra:   normal appearing urethra with no masses, tenderness or lesions              Bartholins and Skenes: normal                 Vagina: normal appearing vagina with normal color and discharge, no lesions. Atrophic changes              Cervix: no lesions Bimanual Exam:  Uterus:  no masses or tenderness              Adnexa: no mass, fullness, tenderness              Rectovaginal: Deferred              Anus:  normal, no lesions  [name], CMA present as chaperone.   Assessment/Plan:  53 y.o. [id number] for annual exam.   Well female exam with routine gynecological exam - Education provided on SBEs, importance of preventative screenings, current guidelines, high calcium diet, regular exercise, and multivitamin daily. Labs with PCP.   Postmenopausal - No HRT, no bleeding.   Depression, unspecified depression type - Plan: FLUoxetine  (PROZAC ) 20 MG capsule every other day. Taking this way works for her. PHQ - 0 today.   Screening for colon cancer - Plan: Ambulatory referral to Gastroenterology.   Cervical cancer screening - Plan: Cytology - PAP( [place]). Normal Pap history.  Pap today per guidelines.   Screening for breast cancer - Normal mammogram history. Continue annual screenings. Normal breast exam today.  Screening for osteoporosis - Average  risk. Will plan Dexa at age 50.   Return in about 1 year (around 06/08/2025) for Annual.     Janet Carney [facility], 2:25 PM 06/08/2024

## 2024-06-13 ENCOUNTER — Ambulatory Visit: Payer: Self-pay | Admitting: Nurse Practitioner

## 2024-06-13 LAB — CYTOLOGY - PAP
Comment: NEGATIVE
Diagnosis: UNDETERMINED — AB
High risk HPV: NEGATIVE

## 2024-08-06 ENCOUNTER — Encounter: Payer: Self-pay | Admitting: Gastroenterology
